# Patient Record
Sex: Male | Born: 1970 | Race: White | Hispanic: No | Marital: Married | Smoking: Former smoker
Health system: Southern US, Community
[De-identification: ages and names within clinical notes are randomized; demographics above are authoritative.]

## PROBLEM LIST (undated history)

## (undated) DIAGNOSIS — E559 Vitamin D deficiency, unspecified: Secondary | ICD-10-CM

## (undated) DIAGNOSIS — G4733 Obstructive sleep apnea (adult) (pediatric): Secondary | ICD-10-CM

## (undated) DIAGNOSIS — I10 Essential (primary) hypertension: Secondary | ICD-10-CM

## (undated) DIAGNOSIS — F32A Depression, unspecified: Secondary | ICD-10-CM

## (undated) DIAGNOSIS — N529 Male erectile dysfunction, unspecified: Secondary | ICD-10-CM

## (undated) DIAGNOSIS — E785 Hyperlipidemia, unspecified: Secondary | ICD-10-CM

## (undated) DIAGNOSIS — R7401 Elevation of levels of liver transaminase levels: Secondary | ICD-10-CM

## (undated) DIAGNOSIS — K219 Gastro-esophageal reflux disease without esophagitis: Secondary | ICD-10-CM

## (undated) DIAGNOSIS — R0683 Snoring: Secondary | ICD-10-CM

## (undated) DIAGNOSIS — R740 Nonspecific elevation of levels of transaminase and lactic acid dehydrogenase [LDH]: Secondary | ICD-10-CM

## (undated) DIAGNOSIS — F329 Major depressive disorder, single episode, unspecified: Secondary | ICD-10-CM

## (undated) DIAGNOSIS — R7989 Other specified abnormal findings of blood chemistry: Secondary | ICD-10-CM

## (undated) HISTORY — DX: Hyperlipidemia, unspecified: E78.5

## (undated) HISTORY — DX: Nonspecific elevation of levels of transaminase and lactic acid dehydrogenase (ldh): R74.0

## (undated) HISTORY — DX: Vitamin D deficiency, unspecified: E55.9

## (undated) HISTORY — DX: Elevation of levels of liver transaminase levels: R74.01

## (undated) HISTORY — DX: Snoring: R06.83

## (undated) HISTORY — DX: Obstructive sleep apnea (adult) (pediatric): G47.33

## (undated) HISTORY — DX: Depression, unspecified: F32.A

## (undated) HISTORY — DX: Male erectile dysfunction, unspecified: N52.9

## (undated) HISTORY — DX: Other specified abnormal findings of blood chemistry: R79.89

## (undated) HISTORY — DX: Gastro-esophageal reflux disease without esophagitis: K21.9

## (undated) HISTORY — DX: Essential (primary) hypertension: I10

## (undated) HISTORY — DX: Major depressive disorder, single episode, unspecified: F32.9

## (undated) HISTORY — DX: Morbid (severe) obesity due to excess calories: E66.01

---

## 2009-08-22 DIAGNOSIS — K219 Gastro-esophageal reflux disease without esophagitis: Secondary | ICD-10-CM | POA: Insufficient documentation

## 2011-02-08 ENCOUNTER — Ambulatory Visit: Payer: Self-pay | Admitting: Orthopedic Surgery

## 2011-02-26 HISTORY — PX: OTHER SURGICAL HISTORY: SHX169

## 2011-06-18 ENCOUNTER — Emergency Department: Payer: Self-pay | Admitting: Emergency Medicine

## 2011-07-10 HISTORY — PX: UMBILICAL HERNIA REPAIR: SHX196

## 2012-11-30 IMAGING — CT CT ABD-PELV W/ CM
1 of 2 series · 15 of 32 positions shown, 19 images · non-contrast
Comparison: none

REASON FOR EXAM: (1) abd pain; (2) abd pain;    NOTE: Nursing to Give
Oral CT Contrast
COMMENTS:   May transport without cardiac monitor

PROCEDURE:     CT  - CT ABDOMEN / PELVIS  W  - June 18, 2011  [DATE]
RESULT:     CT abdomen and pelvis dated 06/18/2011
TECHNIQUE: Helical 3 mm sections were obtained from the lung bases through
the pubic symphysis status post intravenous administration of 100 mL of
Msovue-1LE and oral contrast.

[Series 2: 3mm soft tissue · axial · 0.92mm/px · z∈[-1116,-636]mm · 15 of 176 slices shown, 19 images]
[im 8/176  soft-tissue]
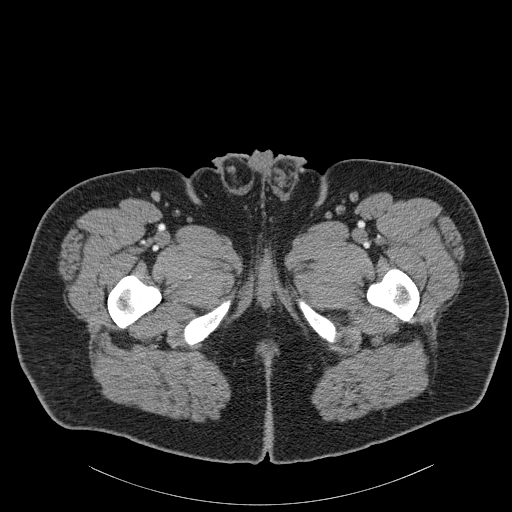
[im 8/176  bone]
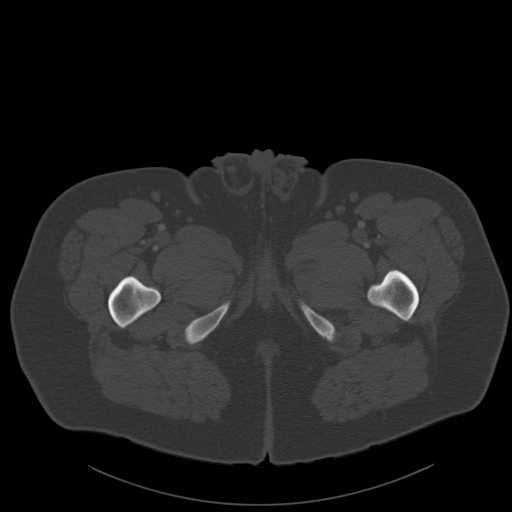
[im 22/176  soft-tissue]
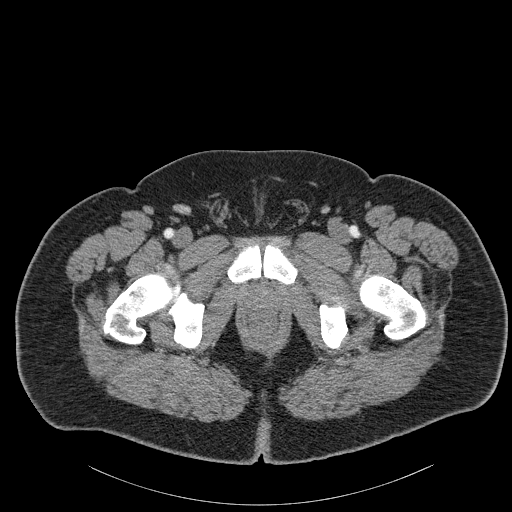
[im 37/176  soft-tissue]
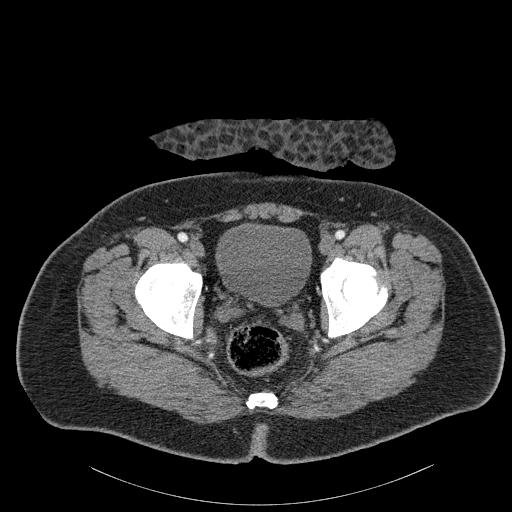
[im 52/176  soft-tissue]
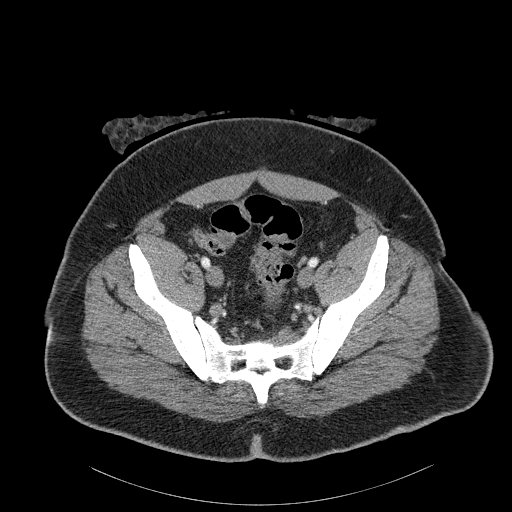
[im 59/176  soft-tissue]
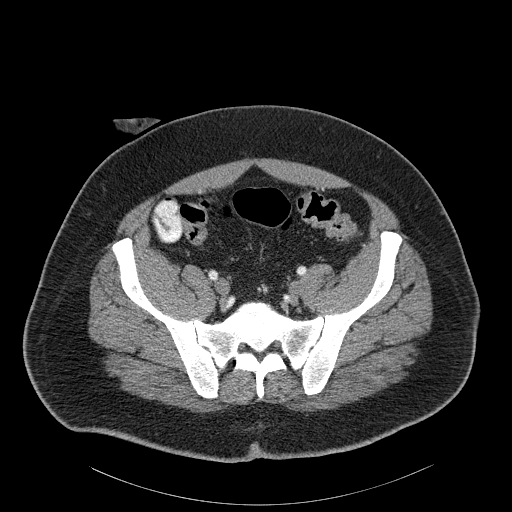
[im 73/176  soft-tissue]
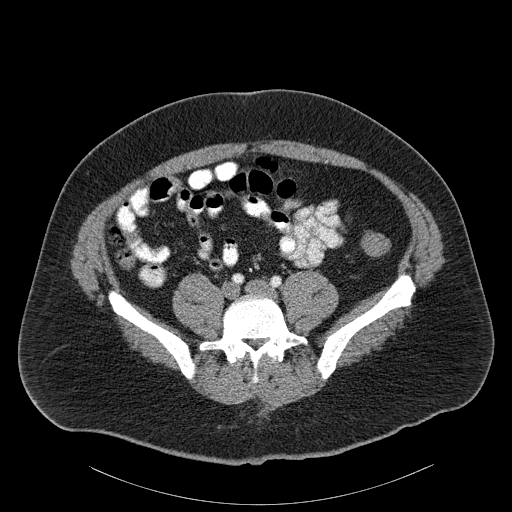
[im 88/176  soft-tissue]
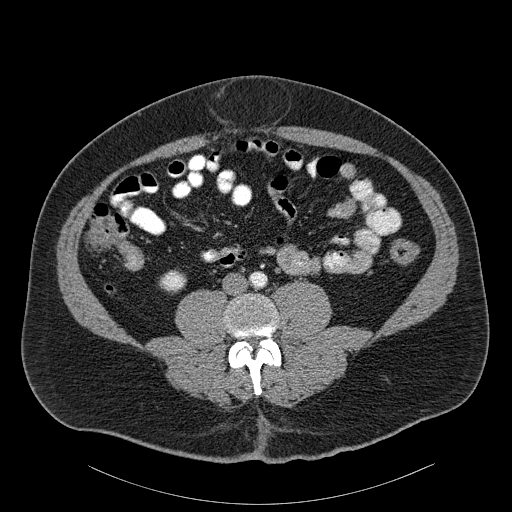
[im 103/176  soft-tissue]
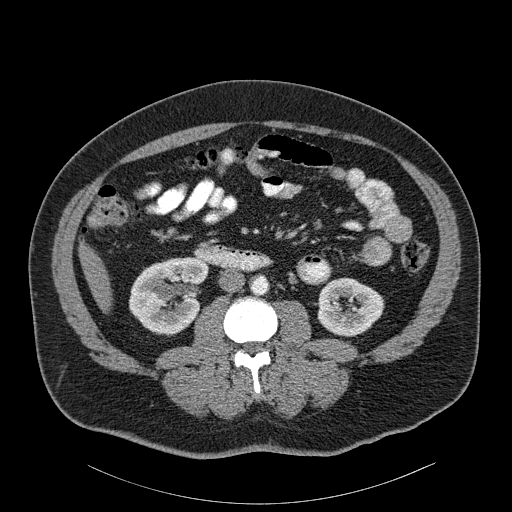
[im 117/176  soft-tissue]
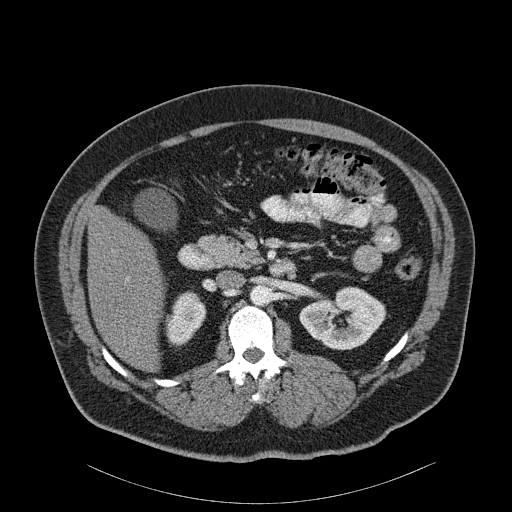
[im 117/176  bone]
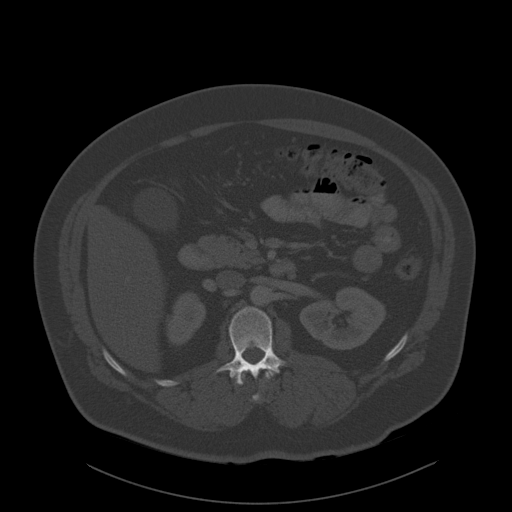
[im 124/176  soft-tissue]
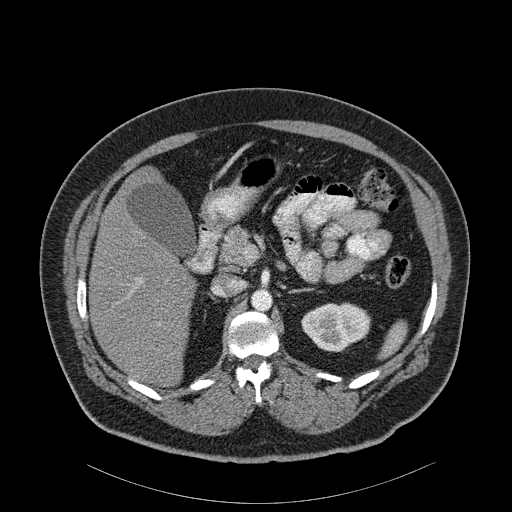
[im 139/176  soft-tissue]
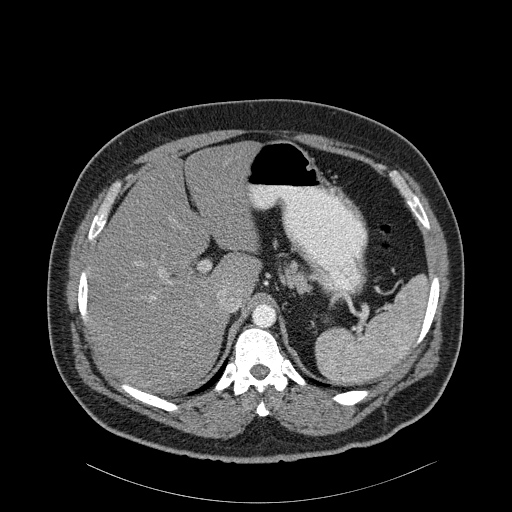
[im 146/176  lung]
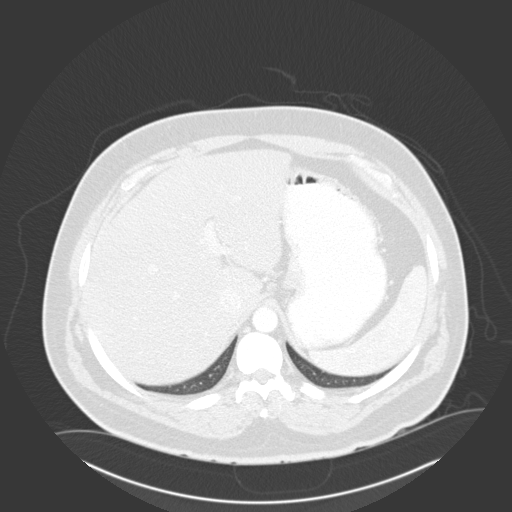
[im 154/176  soft-tissue]
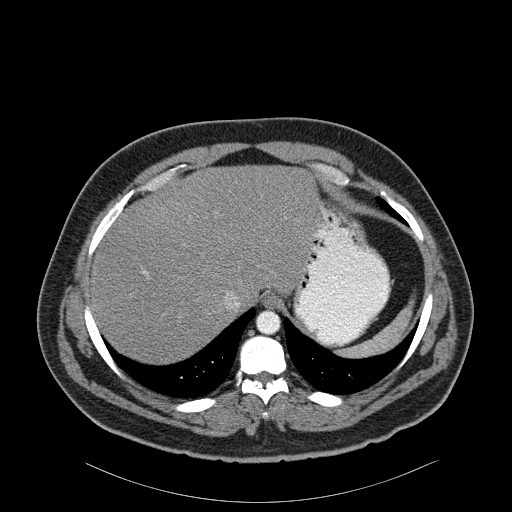
[im 154/176  lung]
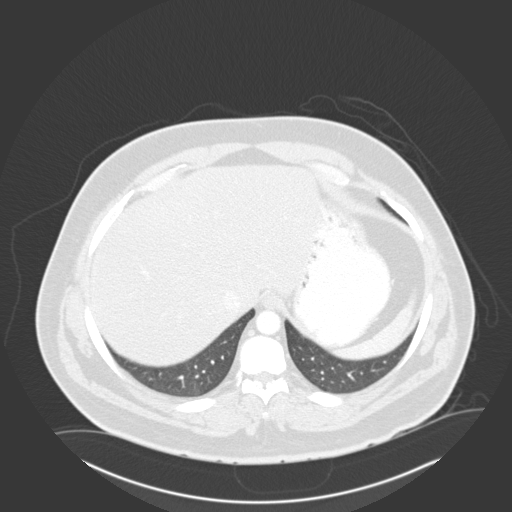
[im 161/176  lung]
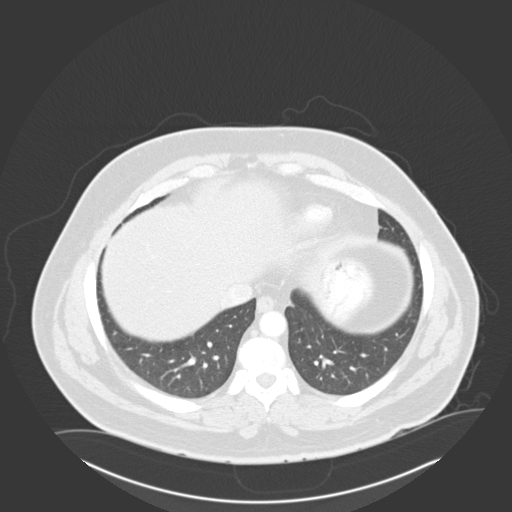
[im 168/176  soft-tissue]
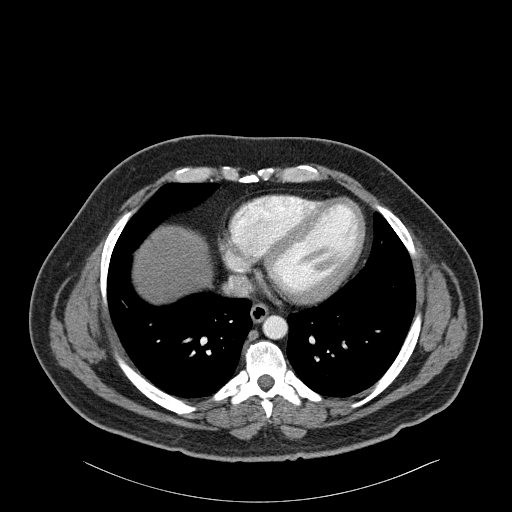
[im 168/176  lung]
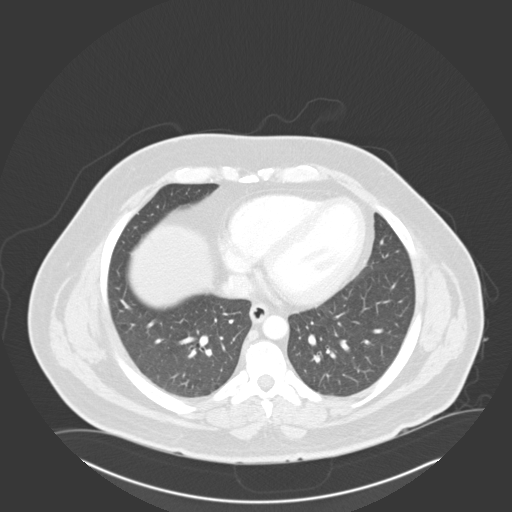

[15 of 32 positions shown; findings below may reference images not displayed]

FINDINGS: The lung bases are unremarkable.

The liver demonstrates a diffuse low attenuating architecture is otherwise
unremarkable. The spleen, adrenals, pancreas, kidneys are unremarkable.

There is no evidence of abdominal or pelvic free fluid, loculated fluid
collections nor adenopathy. No evidence of an abdominal aortic aneurysm. The
appendix is identified and is unremarkable. A fat containing ventral
abdominal wall hernia is appreciated just below the umbilicus.

Evaluation of the transverse colon demonstrates mild thickening of the bowel
wall, diffuse. A component of the thickening is due to decompression. No
appreciable free fluid nor loculated fluid collections surrounding the
bowel. There is mild diverticulosis within the transverse colon. A graft the
celiac, SMA, IMA, portal vein, SMV are opacified. No evidence of bowel
obstruction. The appendix is identified and is unremarkable.
IMPRESSION: Findings which may were present early or mild colitis
involving transverse colon as described above
2. Hepatic steatosis
3. No further evidence of obstructive or inflammatory abnormalities.

## 2013-07-03 ENCOUNTER — Ambulatory Visit: Payer: Self-pay | Admitting: Family Medicine

## 2014-07-01 LAB — HEMOGLOBIN A1C: Hgb A1c MFr Bld: 6.1 % — AB (ref 4.0–6.0)

## 2014-07-01 LAB — LIPID PANEL
Cholesterol: 181 mg/dL (ref 0–200)
HDL: 26 mg/dL — AB (ref 35–70)
LDL Cholesterol: 116 mg/dL
Triglycerides: 197 mg/dL — AB (ref 40–160)

## 2014-09-28 ENCOUNTER — Ambulatory Visit: Payer: Self-pay | Admitting: Family Medicine

## 2015-04-07 ENCOUNTER — Telehealth: Payer: Self-pay

## 2015-04-07 MED ORDER — OMEPRAZOLE 20 MG PO CPDR: 20.0000 mg | DELAYED_RELEASE_CAPSULE | Freq: Every day | ORAL | Status: DC

## 2015-04-07 MED ORDER — ATORVASTATIN CALCIUM 40 MG PO TABS: 40.0000 mg | ORAL_TABLET | Freq: Every day | ORAL | Status: DC

## 2015-04-07 MED ORDER — METFORMIN HCL ER (OSM) 500 MG PO TB24: 500.0000 mg | ORAL_TABLET | Freq: Every day | ORAL | Status: DC

## 2015-04-07 MED ORDER — VENLAFAXINE HCL ER 150 MG PO CP24: 150.0000 mg | ORAL_CAPSULE | Freq: Every day | ORAL | Status: DC

## 2015-04-07 NOTE — Telephone Encounter (Signed)
Sent rx to [facility]

## 2015-04-07 NOTE — Telephone Encounter (Signed)
Pt wife called patient is working out of town, but has already scheduled an appointment for next month, but needs refills for at least one month until he can be seen.  On chol, gerd and mood

## 2015-04-19 ENCOUNTER — Telehealth: Payer: Self-pay | Admitting: Family Medicine

## 2015-04-19 ENCOUNTER — Other Ambulatory Visit: Payer: Self-pay | Admitting: Family Medicine

## 2015-04-19 NOTE — Telephone Encounter (Signed)
Pt wife said that they are on vacation and that her husband forgot his depression medication at home and is getting a request from [place] asking to be able to get 1 week supply sent to [facility] for the patient. This is just a [place]

## 2015-04-19 NOTE — Telephone Encounter (Signed)
Patient requesting refill. 

## 2015-04-19 NOTE — Telephone Encounter (Signed)
Okay to approve.

## 2015-04-23 ENCOUNTER — Encounter: Payer: Self-pay | Admitting: Family Medicine

## 2015-04-23 DIAGNOSIS — Z8719 Personal history of other diseases of the digestive system: Secondary | ICD-10-CM | POA: Insufficient documentation

## 2015-04-23 DIAGNOSIS — N529 Male erectile dysfunction, unspecified: Secondary | ICD-10-CM | POA: Insufficient documentation

## 2015-04-23 DIAGNOSIS — E785 Hyperlipidemia, unspecified: Secondary | ICD-10-CM | POA: Insufficient documentation

## 2015-04-23 DIAGNOSIS — E8881 Metabolic syndrome: Secondary | ICD-10-CM | POA: Insufficient documentation

## 2015-04-23 DIAGNOSIS — E559 Vitamin D deficiency, unspecified: Secondary | ICD-10-CM | POA: Insufficient documentation

## 2015-04-23 DIAGNOSIS — G4733 Obstructive sleep apnea (adult) (pediatric): Secondary | ICD-10-CM | POA: Insufficient documentation

## 2015-04-23 DIAGNOSIS — R7989 Other specified abnormal findings of blood chemistry: Secondary | ICD-10-CM | POA: Insufficient documentation

## 2015-04-23 DIAGNOSIS — Z9889 Other specified postprocedural states: Secondary | ICD-10-CM | POA: Insufficient documentation

## 2015-04-23 DIAGNOSIS — G4726 Circadian rhythm sleep disorder, shift work type: Secondary | ICD-10-CM | POA: Insufficient documentation

## 2015-04-24 ENCOUNTER — Encounter: Payer: Self-pay | Admitting: Family Medicine

## 2015-04-24 ENCOUNTER — Ambulatory Visit (INDEPENDENT_AMBULATORY_CARE_PROVIDER_SITE_OTHER): Payer: BLUE CROSS/BLUE SHIELD | Admitting: Family Medicine

## 2015-04-24 VITALS — BP 134/76 | HR 82 | Temp 98.1°F | Resp 16 | Ht 70.0 in | Wt 324.2 lb

## 2015-04-24 DIAGNOSIS — E668 Other obesity: Secondary | ICD-10-CM

## 2015-04-24 DIAGNOSIS — E559 Vitamin D deficiency, unspecified: Secondary | ICD-10-CM | POA: Diagnosis not present

## 2015-04-24 DIAGNOSIS — K219 Gastro-esophageal reflux disease without esophagitis: Secondary | ICD-10-CM | POA: Diagnosis not present

## 2015-04-24 DIAGNOSIS — E8881 Metabolic syndrome: Secondary | ICD-10-CM | POA: Diagnosis not present

## 2015-04-24 DIAGNOSIS — N528 Other male erectile dysfunction: Secondary | ICD-10-CM | POA: Diagnosis not present

## 2015-04-24 DIAGNOSIS — Z79899 Other long term (current) drug therapy: Secondary | ICD-10-CM

## 2015-04-24 DIAGNOSIS — G4733 Obstructive sleep apnea (adult) (pediatric): Secondary | ICD-10-CM

## 2015-04-24 DIAGNOSIS — E785 Hyperlipidemia, unspecified: Secondary | ICD-10-CM | POA: Diagnosis not present

## 2015-04-24 DIAGNOSIS — N529 Male erectile dysfunction, unspecified: Secondary | ICD-10-CM

## 2015-04-24 DIAGNOSIS — F329 Major depressive disorder, single episode, unspecified: Secondary | ICD-10-CM

## 2015-04-24 DIAGNOSIS — F32A Depression, unspecified: Secondary | ICD-10-CM

## 2015-04-24 MED ORDER — VENLAFAXINE HCL ER 75 MG PO CP24: 225.0000 mg | ORAL_CAPSULE | Freq: Every day | ORAL | Status: DC

## 2015-04-24 MED ORDER — SILDENAFIL CITRATE 20 MG PO TABS: 40.0000 mg | ORAL_TABLET | ORAL | Status: DC | PRN

## 2015-04-24 MED ORDER — ATORVASTATIN CALCIUM 40 MG PO TABS: 40.0000 mg | ORAL_TABLET | Freq: Every day | ORAL | Status: DC

## 2015-04-24 MED ORDER — OMEPRAZOLE 20 MG PO CPDR: 20.0000 mg | DELAYED_RELEASE_CAPSULE | Freq: Every day | ORAL | Status: DC

## 2015-04-24 MED ORDER — METFORMIN HCL ER (OSM) 500 MG PO TB24: 500.0000 mg | ORAL_TABLET | Freq: Every day | ORAL | Status: DC

## 2015-04-24 NOTE — Progress Notes (Signed)
Name: Jimmy Bryant   MRN: [id number]    DOB: 1971-08-12   Date:04/24/2015       Progress Note  Subjective  Chief Complaint  Chief Complaint  Patient presents with  . Medication Refill    6 week F/U  . Obesity    Last visit- Started Contrave-Patient states he felt nothing and did not help weight loss-Actually patient gained 8 pounds.     HPI  Dysmetabolic Syndrome: taking Metformin ER 500mg , gaining weight, avoiding sweet beverages.  Gaining weight, due for repeat labs.   OSA: using CPAP machine every night , all night long, excellent compliance, he feels well when he wakes up.  ED: using generic Viagra, works for him, but it takes hours.  Denies side effects of medication.  Obesity: tried Contrave without success. He states he has decreased soda intake and when he drinks he drinks Sprite Zero. Still gaining weight. Wife is going to nutritionist and he will try to follow her diet.  Depression: doing well on medication, but feels like wears by 5 pm, we will adjust dose. He gets irritable towards the end of the day. He denies anhedonia. He states he went out medication for a couple of days and felt down for a few days.  Patient Active Problem List   Diagnosis Date Noted  . Circadian rhythm sleep disorder, shift work type 04/23/2015  . Dyslipidemia 04/23/2015  . H/O lumbosacral spine surgery 04/23/2015  . H/O umbilical hernia repair 92/42/6834  . Dysmetabolic syndrome 19/62/2297  . Extreme obesity 04/23/2015  . ED (erectile dysfunction) of organic origin 04/23/2015  . Obstructive apnea 04/23/2015  . Vitamin D deficiency 04/23/2015  . Decreased testosterone level 04/23/2015  . Gastro-esophageal reflux disease without esophagitis 08/22/2009  . Clinical depression 04/07/2009    Past Surgical History  Procedure Laterality Date  . Umbilical hernia repair  07/10/2011  . Microdecompression lumbar spine  02/2011    L4-5 and L5-S1 by Dr. Babette Relic    Family History  Problem  Relation Age of Onset  . Diabetes Mother   . Hypertension Father   . CAD Father   . Hypertension Sister   . Hypertension Daughter     History   Social History  . Marital Status: Married    Spouse Name: N/A  . Number of Children: N/A  . Years of Education: N/A   Occupational History  . Not on file.   Social History Main Topics  . Smoking status: Former Smoker -- 1.00 packs/day for 31 years    Types: Cigarettes    Start date: 10/28/1980    Quit date: 06/28/2012  . Smokeless tobacco: Never Used  . Alcohol Use: 0.0 oz/week    0 Standard drinks or equivalent per week     Comment: rarely-a beer a month  . Drug Use: No  . Sexual Activity: No   Other Topics Concern  . Not on file   Social History Narrative     Current outpatient prescriptions:  .  aspirin 81 MG tablet, Take 1 tablet by mouth daily., Disp: , Rfl:  .  atorvastatin (LIPITOR) 40 MG tablet, Take 1 tablet (40 mg total) by mouth daily., Disp: 30 tablet, Rfl: 3 .  Cholecalciferol (VITAMIN D3) 2000 UNITS capsule, Take 1 capsule by mouth daily., Disp: , Rfl:  .  ibuprofen (ADVIL,MOTRIN) 200 MG tablet, Take 2 tablets by mouth as needed., Disp: , Rfl:  .  omeprazole (PRILOSEC) 20 MG capsule, Take 1 capsule (20 mg total) by  mouth daily., Disp: 30 capsule, Rfl: 3 .  metformin (FORTAMET) 500 MG (OSM) 24 hr tablet, Take 1 tablet (500 mg total) by mouth daily with breakfast., Disp: 30 tablet, Rfl: 3 .  sildenafil (REVATIO) 20 MG tablet, Take 2-3 tablets (40-60 mg total) by mouth as needed., Disp: 10 tablet, Rfl: 3 .  venlafaxine XR (EFFEXOR XR) 75 MG 24 hr capsule, Take 3 capsules (225 mg total) by mouth daily with breakfast., Disp: 90 capsule, Rfl: 3  No Known Allergies   ROS  Constitutional: Negative for fever and positive weight change.  Respiratory: Negative for cough and shortness of breath.   Cardiovascular: Negative for chest pain , one episode of  Palpitations lasted a few seconds  Gastrointestinal: Negative  for abdominal pain, no bowel changes.  Musculoskeletal: Negative for gait problem or joint swelling.  Skin: Negative for rash.  Neurological: Negative for dizziness or headache.  No other specific complaints in a complete review of systems (except as listed in HPI above).  Objective  Filed Vitals:   04/24/15 1043  BP: 144/78  Pulse: 82  Temp: 98.1 F (36.7 C)  TempSrc: Oral  Resp: 16  Height: 5\' 10"  (1.778 m)  Weight: 324 lb 3.2 oz (147.056 kg)  SpO2: 96%    Body mass index is 46.52 kg/(m^2).  Physical Exam  Constitutional: Patient appears well-developed and well-nourished. No distress.  Eyes:  No scleral icterus.  Neck: Normal range of motion. Neck supple. Cardiovascular: Normal rate, regular rhythm and normal heart sounds.  No murmur heard. No BLE edema. Pulmonary/Chest: Effort normal and breath sounds normal. No respiratory distress. Abdominal: Soft.  There is no tenderness. Psychiatric: Patient has a normal mood and affect. behavior is normal. Judgment and thought content normal.     PHQ2/9: Depression screen PHQ 2/9 04/24/2015  Decreased Interest 2  Down, Depressed, Hopeless 0  PHQ - 2 Score 2  Altered sleeping 0  Tired, decreased energy 2  Change in appetite 0  Feeling bad or failure about yourself  3  Trouble concentrating 0  Moving slowly or fidgety/restless 0  Suicidal thoughts 0  PHQ-9 Score 7  Difficult doing work/chores Not difficult at all     Fall Risk: Fall Risk  04/24/2015  Falls in the past year? No     Assessment & Plan  1. Obstructive apnea Compliant with CPAP   2. Vitamin D deficiency  - Vitamin D (25 hydroxy)  3.ED  - sildenafil (REVATIO) 20 MG tablet; Take 2-3 tablets (40-60 mg total) by mouth as needed.  Dispense: 10 tablet; Refill: 3  4. Dysmetabolic syndrome  - HgB J6R - metformin (FORTAMET) 500 MG (OSM) 24 hr tablet; Take 1 tablet (500 mg total) by mouth daily with breakfast.  Dispense: 30 tablet; Refill: 3  5.  Extreme obesity Discussed diet and exercise  6. Dyslipidemia  - Lipid Profile - atorvastatin (LIPITOR) 40 MG tablet; Take 1 tablet (40 mg total) by mouth daily.  Dispense: 30 tablet; Refill: 3  7. Clinical depression Increase dose of medication - venlafaxine XR (EFFEXOR XR) 75 MG 24 hr capsule; Take 3 capsules (225 mg total) by mouth daily with breakfast.  Dispense: 90 capsule; Refill: 3  8. Long-term use of high-risk medication  - Comprehensive Metabolic Panel (CMET)  9. Gastroesophageal reflux disease without esophagitis  - omeprazole (PRILOSEC) 20 MG capsule; Take 1 capsule (20 mg total) by mouth daily.  Dispense: 30 capsule; Refill: 3

## 2015-04-24 NOTE — Patient Instructions (Signed)

## 2015-04-29 LAB — LIPID PANEL
Chol/HDL Ratio: 4.4 ratio units (ref 0.0–5.0)
Cholesterol, Total: 128 mg/dL (ref 100–199)
HDL: 29 mg/dL — ABNORMAL LOW (ref >39)
LDL Calculated: 75 mg/dL (ref 0–99)
Triglycerides: 118 mg/dL (ref 0–149)
VLDL Cholesterol Cal: 24 mg/dL (ref 5–40)

## 2015-04-29 LAB — COMPREHENSIVE METABOLIC PANEL
ALT: 46 IU/L — ABNORMAL HIGH (ref 0–44)
AST: 30 IU/L (ref 0–40)
Albumin/Globulin Ratio: 1.5 (ref 1.1–2.5)
Albumin: 4.3 g/dL (ref 3.5–5.5)
Alkaline Phosphatase: 72 IU/L (ref 39–117)
BUN/Creatinine Ratio: 16 (ref 9–20)
BUN: 12 mg/dL (ref 6–24)
Bilirubin Total: 0.5 mg/dL (ref 0.0–1.2)
CO2: 25 mmol/L (ref 18–29)
Calcium: 9.5 mg/dL (ref 8.7–10.2)
Chloride: 98 mmol/L (ref 97–108)
Creatinine, Ser: 0.77 mg/dL (ref 0.76–1.27)
GFR calc Af Amer: 127 mL/min/{1.73_m2} (ref >59)
GFR calc non Af Amer: 110 mL/min/{1.73_m2} (ref >59)
Globulin, Total: 2.9 g/dL (ref 1.5–4.5)
Glucose: 100 mg/dL — ABNORMAL HIGH (ref 65–99)
Potassium: 4.7 mmol/L (ref 3.5–5.2)
Sodium: 138 mmol/L (ref 134–144)
Total Protein: 7.2 g/dL (ref 6.0–8.5)

## 2015-04-29 LAB — HEMOGLOBIN A1C
Est. average glucose Bld gHb Est-mCnc: 140 mg/dL
Hgb A1c MFr Bld: 6.5 % — ABNORMAL HIGH (ref 4.8–5.6)

## 2015-04-29 LAB — VITAMIN D 25 HYDROXY (VIT D DEFICIENCY, FRACTURES): Vit D, 25-Hydroxy: 33.1 ng/mL (ref 30.0–100.0)

## 2015-04-30 ENCOUNTER — Encounter: Payer: Self-pay | Admitting: Family Medicine

## 2015-04-30 DIAGNOSIS — E1169 Type 2 diabetes mellitus with other specified complication: Secondary | ICD-10-CM | POA: Insufficient documentation

## 2015-04-30 DIAGNOSIS — E785 Hyperlipidemia, unspecified: Secondary | ICD-10-CM | POA: Insufficient documentation

## 2015-08-28 ENCOUNTER — Ambulatory Visit (INDEPENDENT_AMBULATORY_CARE_PROVIDER_SITE_OTHER): Payer: PRIVATE HEALTH INSURANCE | Admitting: Family Medicine

## 2015-08-28 ENCOUNTER — Encounter: Payer: Self-pay | Admitting: Family Medicine

## 2015-08-28 VITALS — BP 138/72 | HR 87 | Temp 98.3°F | Resp 18 | Ht 70.0 in | Wt 324.9 lb

## 2015-08-28 DIAGNOSIS — E785 Hyperlipidemia, unspecified: Secondary | ICD-10-CM | POA: Diagnosis not present

## 2015-08-28 DIAGNOSIS — K219 Gastro-esophageal reflux disease without esophagitis: Secondary | ICD-10-CM

## 2015-08-28 DIAGNOSIS — R109 Unspecified abdominal pain: Secondary | ICD-10-CM | POA: Diagnosis not present

## 2015-08-28 DIAGNOSIS — G4733 Obstructive sleep apnea (adult) (pediatric): Secondary | ICD-10-CM

## 2015-08-28 DIAGNOSIS — F330 Major depressive disorder, recurrent, mild: Secondary | ICD-10-CM

## 2015-08-28 DIAGNOSIS — Z23 Encounter for immunization: Secondary | ICD-10-CM

## 2015-08-28 DIAGNOSIS — E118 Type 2 diabetes mellitus with unspecified complications: Secondary | ICD-10-CM

## 2015-08-28 DIAGNOSIS — N528 Other male erectile dysfunction: Secondary | ICD-10-CM

## 2015-08-28 DIAGNOSIS — N529 Male erectile dysfunction, unspecified: Secondary | ICD-10-CM

## 2015-08-28 LAB — POCT URINALYSIS DIPSTICK
Bilirubin, UA: NEGATIVE
Blood, UA: NEGATIVE
Glucose, UA: NEGATIVE
Ketones, UA: NEGATIVE
Leukocytes, UA: NEGATIVE
Nitrite, UA: NEGATIVE
Protein, UA: NEGATIVE
Spec Grav, UA: 1.015
Urobilinogen, UA: NEGATIVE
pH, UA: 5

## 2015-08-28 LAB — POCT GLYCOSYLATED HEMOGLOBIN (HGB A1C): Hemoglobin A1C: 6.2

## 2015-08-28 LAB — POCT UA - MICROALBUMIN: Microalbumin Ur, POC: 20 mg/L

## 2015-08-28 MED ORDER — SILDENAFIL CITRATE 100 MG PO TABS: 50.0000 mg | ORAL_TABLET | Freq: Every day | ORAL | Status: DC | PRN

## 2015-08-28 MED ORDER — METFORMIN HCL ER (OSM) 500 MG PO TB24: 500.0000 mg | ORAL_TABLET | Freq: Every day | ORAL | Status: DC

## 2015-08-28 MED ORDER — VENLAFAXINE HCL ER 75 MG PO CP24: 225.0000 mg | ORAL_CAPSULE | Freq: Every day | ORAL | Status: DC

## 2015-08-28 MED ORDER — ATORVASTATIN CALCIUM 40 MG PO TABS: 40.0000 mg | ORAL_TABLET | Freq: Every day | ORAL | Status: DC

## 2015-08-28 NOTE — Progress Notes (Signed)
Name: Jimmy Bryant   MRN: [id number]    DOB: Nov 23, 1970   Date:08/28/2015       Progress Note  Subjective  Chief Complaint  Chief Complaint  Patient presents with  . Medication Refill    3 month F/U  . Diabetes    Does not check sugar at home, states every once in a while he will check it and it would be low-110  . Gastroesophageal Reflux    well controlled taking every 3 days and zantac in between  . Hyperlipidemia    muscle cramps  . Depression    improving  . 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 No polydipsia , polyuria or polyphagia. Taking Metformin and denies side effects. Taking aspirin daily   Hyperlipidemia: he is taking Atorvastatin and states he has noticed some muscle cramps lately. He also states that when cold he forgets to drink water. He states it is usually at the end of the day, and can happen on calves on lower arms. Helps when he gets up and moves around ( discussed RLS- but he does not want medication at this time)   Obesity: he has gained a lot of weight since last visit, discussed life style modification with him again  ED: he has been taking generic Viagra, but is more expensive than the brand medication since he needs take 3 daily. He states it works well to initiate and erection, but erection does not stay for long  OSA: using CPAP machine every night, all night. Feels more rested, he has also noticed that he can dream now.   Back pain: he has noticed some pain/discomfort on left mid to lower back, when he sits down either to watch TV or drive, pain resolves when he shifts in his seat. Described an aggravating pain but not severe.   Major Depression; doing better on 3 pills of  Effexor daily, states symptoms of depression present since childhood. He is feeling well at this time, normal level of energy, able to focus better and no suicidal thoughts or ideation  Patient Active Problem List   Diagnosis Date Noted  . Diabetes mellitus type II, controlled ([place]) 04/30/2015  . Circadian rhythm sleep disorder, shift work type 04/23/2015  . Dyslipidemia 04/23/2015  . H/O lumbosacral spine surgery 04/23/2015  . H/O umbilical hernia repair 09/38/1829  . Dysmetabolic syndrome 93/71/6967  . Extreme obesity ([place]) 04/23/2015  . ED (erectile dysfunction) of organic origin 04/23/2015  . Obstructive apnea 04/23/2015  . Vitamin D deficiency 04/23/2015  . Decreased testosterone level 04/23/2015  . Gastro-esophageal reflux disease without esophagitis 08/22/2009  . Clinical depression 04/07/2009    Past Surgical History  Procedure Laterality Date  . Umbilical hernia repair  07/10/2011  . Microdecompression lumbar spine  02/2011    L4-5 and L5-S1 by Dr. Babette Relic    Family History  Problem Relation Age of Onset  . Diabetes Mother   . Hypertension Father   . CAD Father   . Hypertension Sister   . Hypertension Daughter     Social History   Social History  . Marital Status: Married  Spouse Name: N/A  . Number of Children: N/A  . Years of Education: N/A   Occupational History  . Not on file.   Social History Main Topics  . Smoking status: Former Smoker -- 1.00 packs/day for 31 years    Types: Cigarettes    Start date: 10/28/1980    Quit date: 06/28/2012  . Smokeless tobacco: Never Used  . Alcohol Use: 0.0 oz/week    0 Standard drinks or equivalent per week     Comment: rarely-a beer a month  . Drug Use: No  . Sexual Activity:    Partners: Female   Other Topics Concern  . Not on file   Social History Narrative     Current outpatient prescriptions:  .  aspirin 81 MG tablet, Take 1 tablet by mouth daily., Disp: , Rfl:  .  atorvastatin (LIPITOR) 40 MG  tablet, Take 1 tablet (40 mg total) by mouth daily., Disp: 30 tablet, Rfl: 3 .  Cholecalciferol (VITAMIN D3) 2000 UNITS capsule, Take 1 capsule by mouth daily., Disp: , Rfl:  .  ibuprofen (ADVIL,MOTRIN) 200 MG tablet, Take 2 tablets by mouth as needed., Disp: , Rfl:  .  metformin (FORTAMET) 500 MG (OSM) 24 hr tablet, Take 1 tablet (500 mg total) by mouth daily with breakfast., Disp: 30 tablet, Rfl: 3 .  omeprazole (PRILOSEC) 20 MG capsule, Take 1 capsule (20 mg total) by mouth daily., Disp: 30 capsule, Rfl: 3 .  sildenafil (VIAGRA) 100 MG tablet, Take 0.5-1 tablets (50-100 mg total) by mouth daily as needed for erectile dysfunction., Disp: 10 tablet, Rfl: 5 .  venlafaxine XR (EFFEXOR XR) 75 MG 24 hr capsule, Take 3 capsules (225 mg total) by mouth daily with breakfast., Disp: 90 capsule, Rfl: 3  No Known Allergies   ROS  Constitutional: Negative for fever, positive for  weight change.  Respiratory: Negative for cough and shortness of breath.   Cardiovascular: Negative for chest pain or palpitations.  Gastrointestinal: Negative for abdominal pain, no bowel changes.  Musculoskeletal: Negative for gait problem or joint swelling.  Skin: Negative for rash.  Neurological: Negative for dizziness or headache.  No other specific complaints in a complete review of systems (except as listed in HPI above).  Objective  Filed Vitals:   08/28/15 0825  BP: 138/72  Pulse: 87  Temp: 98.3 F (36.8 C)  TempSrc: Oral  Resp: 18  Height: 5\' 10"  (1.778 m)  Weight: 324 lb 14.4 oz (147.374 kg)  SpO2: 98%    Body mass index is 46.62 kg/(m^2).  Physical Exam  Constitutional: Patient appears well-developed and well-nourished. Obese No distress.  HEENT: head atraumatic, normocephalic, pupils equal and reactive to light,  neck supple, throat within normal limits Cardiovascular: Normal rate, regular rhythm and normal heart sounds.  No murmur heard. No BLE edema. Pulmonary/Chest: Effort normal and breath  sounds normal. No respiratory distress. Abdominal: Soft.  There is no tenderness. Psychiatric: Patient has a normal mood and affect. behavior is normal. Judgment and thought content normal. Muscular Skeletal: point tenderness during palpation of left mid thoracic area and towards left flank, it seems muscular even though around left CVA area. No rashes. Normal rom of lumbar spine.   Recent Results (from the past 2160 hour(s))  POCT HgB A1C     Status: Abnormal   Collection Time: 08/28/15  8:29 AM  Result Value Ref Range   Hemoglobin A1C 6.2   POCT UA - Microalbumin     Status: Abnormal   Collection  Time: 08/28/15  8:29 AM  Result Value Ref Range   Microalbumin Ur, POC 20 mg/L   Creatinine, POC  mg/dL   Albumin/Creatinine Ratio, Urine, POC      Diabetic Foot Exam: Diabetic Foot Exam - Simple   Simple Foot Form  Visual Inspection  No deformities, no ulcerations, no other skin breakdown bilaterally:  Yes  Sensation Testing  Pulse Check  Comments      PHQ2/9: Depression screen [facility] 2/9 08/28/2015 04/24/2015  Decreased Interest 0 2  Down, Depressed, Hopeless 0 0  PHQ - 2 Score 0 2  Altered sleeping - 0  Tired, decreased energy - 2  Change in appetite - 0  Feeling bad or failure about yourself  - 3  Trouble concentrating - 0  Moving slowly or fidgety/restless - 0  Suicidal thoughts - 0  PHQ-9 Score - 7  Difficult doing work/chores - Not difficult at all    Fall Risk: Fall Risk  08/28/2015 04/24/2015  Falls in the past year? No No     Functional Status Survey: Is the patient deaf or have difficulty hearing?: No Does the patient have difficulty seeing, even when wearing glasses/contacts?: Yes (glasses) Does the patient have difficulty concentrating, remembering, or making decisions?: No Does the patient have difficulty walking or climbing stairs?: No Does the patient have difficulty dressing or bathing?: No Does the patient have difficulty doing errands alone such as  visiting a doctor's office or shopping?: No    Assessment & Plan  1. Controlled type 2 diabetes mellitus with complication, without long-term current use of insulin (HCC)  - POCT HgB A1C - POCT UA - Microalbumin - metformin (FORTAMET) 500 MG (OSM) 24 hr tablet; Take 1 tablet (500 mg total) by mouth daily with breakfast.  Dispense: 30 tablet; Refill: 3  2. Needs flu shot  - Flu Vaccine QUAD 36+ mos PF IM (Fluarix & Fluzone Quad PF)  3. Obstructive apnea  Continue CPAP machine use daily   4. ED (erectile dysfunction) of organic origin  - sildenafil (VIAGRA) 100 MG tablet; Take 0.5-1 tablets (50-100 mg total) by mouth daily as needed for erectile dysfunction.  Dispense: 10 tablet; Refill: 5  5. Morbid obesity due to excess calories [facility])  Discussed with the patient the risk posed by an increased BMI. Discussed importance of portion control, calorie counting and at least 150 minutes of physical activity weekly. Avoid sweet beverages and drink more water. Eat at least 6 servings of fruit and vegetables daily   6. Gastroesophageal reflux disease without esophagitis  Weaning off Omeprazole  7. Mild episode of recurrent major depressive disorder (HCC)  Doing well on medication, higher dose - venlafaxine XR (EFFEXOR XR) 75 MG 24 hr capsule; Take 3 capsules (225 mg total) by mouth daily with breakfast.  Dispense: 90 capsule; Refill: 3  8. Dyslipidemia  Continue medication, symptoms of spasms, seems to be RLS but he wants to hold off on medication  - atorvastatin (LIPITOR) 40 MG tablet; Take 1 tablet (40 mg total) by mouth daily.  Dispense: 30 tablet; Refill: 3   9. Left flank pain  We will check for blood and if negative, he will try chiropractor ( wife goes to one ) and if no improvement he will call back for kidney US.  - POCT Urinalysis Dipstick

## 2015-09-11 ENCOUNTER — Ambulatory Visit (INDEPENDENT_AMBULATORY_CARE_PROVIDER_SITE_OTHER): Payer: PRIVATE HEALTH INSURANCE | Admitting: Family Medicine

## 2015-09-11 ENCOUNTER — Encounter: Payer: Self-pay | Admitting: Family Medicine

## 2015-09-11 VITALS — BP 134/86 | HR 91 | Temp 97.9°F | Resp 18 | Ht 70.0 in | Wt 329.2 lb

## 2015-09-11 DIAGNOSIS — R208 Other disturbances of skin sensation: Secondary | ICD-10-CM | POA: Diagnosis not present

## 2015-09-11 DIAGNOSIS — Z0000 Encounter for general adult medical examination without abnormal findings: Secondary | ICD-10-CM | POA: Diagnosis not present

## 2015-09-11 DIAGNOSIS — Z23 Encounter for immunization: Secondary | ICD-10-CM | POA: Diagnosis not present

## 2015-09-11 MED ORDER — AMITRIPTYLINE HCL 10 MG PO TABS
10.0000 mg | ORAL_TABLET | Freq: Every day | ORAL | Status: DC
Start: 2015-09-11 — End: 2016-05-31

## 2015-09-11 NOTE — Progress Notes (Signed)
Name: Jimmy Bryant   MRN: [id number]    DOB: 1971-03-06   Date:09/11/2015       Progress Note  Subjective  Chief Complaint  Chief Complaint  Patient presents with  . Annual Exam    HPI  Male Physical : he denies symptoms of BPH such as hesitancy, straining, dribbling or nocturia. He continues to have decrease in libido and ED, tried multiple medications without improvement.  Seen by Urologist in the past.   Patient Active Problem List   Diagnosis Date Noted  . Diabetes mellitus type II, controlled ([place]) 04/30/2015  . Circadian rhythm sleep disorder, shift work type 04/23/2015  . Dyslipidemia 04/23/2015  . H/O lumbosacral spine surgery 04/23/2015  . H/O umbilical hernia repair 123456  . Dysmetabolic syndrome 123456  . Extreme obesity ([place]) 04/23/2015  . ED (erectile dysfunction) of organic origin 04/23/2015  . Obstructive apnea 04/23/2015  . Vitamin D deficiency 04/23/2015  . Decreased testosterone level 04/23/2015  . Gastro-esophageal reflux disease without esophagitis 08/22/2009  . Clinical depression 04/07/2009    Past Surgical History  Procedure Laterality Date  . Umbilical hernia repair  07/10/2011  . Microdecompression lumbar spine  02/2011    L4-5 and L5-S1 by Dr. Babette Relic    Family History  Problem Relation Age of Onset  . Diabetes Mother   . Hypertension Father   . CAD Father   . Hypertension Sister   . Hypertension Daughter     Social History   Social History  . Marital Status: Married    Spouse Name: N/A  . Number of Children: N/A  . Years of Education: N/A   Occupational History  . Not on file.   Social History Main Topics  . Smoking status: Former Smoker -- 1.00 packs/day for 31 years    Types: Cigarettes    Start date: 10/28/1980    Quit date: 06/28/2012  . Smokeless tobacco: Never Used  . Alcohol Use: 0.0 oz/week    0 Standard drinks or equivalent per week     Comment: rarely-a beer a month  . Drug Use: No  . Sexual  Activity:    Partners: Female   Other Topics Concern  . Not on file   Social History Narrative     Current outpatient prescriptions:  .  aspirin 81 MG tablet, Take 1 tablet by mouth daily., Disp: , Rfl:  .  atorvastatin (LIPITOR) 40 MG tablet, Take 1 tablet (40 mg total) by mouth daily., Disp: 30 tablet, Rfl: 3 .  Cholecalciferol (VITAMIN D3) 2000 UNITS capsule, Take 1 capsule by mouth daily., Disp: , Rfl:  .  ibuprofen (ADVIL,MOTRIN) 200 MG tablet, Take 2 tablets by mouth as needed., Disp: , Rfl:  .  metformin (FORTAMET) 500 MG (OSM) 24 hr tablet, Take 1 tablet (500 mg total) by mouth daily with breakfast., Disp: 30 tablet, Rfl: 3 .  omeprazole (PRILOSEC) 20 MG capsule, Take 1 capsule (20 mg total) by mouth daily., Disp: 30 capsule, Rfl: 3 .  sildenafil (VIAGRA) 100 MG tablet, Take 0.5-1 tablets (50-100 mg total) by mouth daily as needed for erectile dysfunction., Disp: 10 tablet, Rfl: 5 .  venlafaxine XR (EFFEXOR XR) 75 MG 24 hr capsule, Take 3 capsules (225 mg total) by mouth daily with breakfast., Disp: 90 capsule, Rfl: 3 .  amitriptyline (ELAVIL) 10 MG tablet, Take 1 tablet (10 mg total) by mouth at bedtime., Disp: 30 tablet, Rfl: 2  No Known Allergies   ROS  Constitutional: Negative for fever ,  she has weight change.  Respiratory: Negative for cough and shortness of breath.   Cardiovascular: Negative for chest pain or palpitations.  Gastrointestinal: Negative for abdominal pain, no bowel changes.  Musculoskeletal: Negative for gait problem or joint swelling.  Skin: Negative for rash.  Neurological: Negative for dizziness or headache.  No other specific complaints in a complete review of systems (except as listed in HPI above).  Objective  Filed Vitals:   09/11/15 1208  BP: 134/86  Pulse: 91  Temp: 97.9 F (36.6 C)  TempSrc: Oral  Resp: 18  Height: 5\' 10"  (1.778 m)  Weight: 329 lb 3.2 oz (149.324 kg)  SpO2: 96%    Body mass index is 47.24 kg/(m^2).  Physical  Exam  Constitutional: Patient appears well-developed , obese. No distress.  HENT: Head: Normocephalic and atraumatic. Ears: B TMs ok, no erythema or effusion; Nose: Nose normal. Mouth/Throat: Oropharynx is clear and moist. No oropharyngeal exudate.  Eyes: Conjunctivae and EOM are normal. Pupils are equal, round, and reactive to light. No scleral icterus.  Neck: Normal range of motion. Neck supple. No JVD present. No thyromegaly present.  Cardiovascular: Normal rate, regular rhythm and normal heart sounds.  No murmur heard. No BLE edema. Pulmonary/Chest: Effort normal and breath sounds normal. No respiratory distress. Abdominal: Soft. Bowel sounds are normal, no distension. There is no tenderness. no masses MALE GENITALIA: Normal descended testes bilaterally, no masses palpated, no hernias, no lesions, no discharge, very small penis secondary to obesity RECTAL: Prostate normal size and consistency, no rectal masses or hemorrhoids Musculoskeletal: Normal range of motion, no joint effusions. No gross deformities Neurological: he is alert and oriented to person, place, and time. No cranial nerve deficit. Coordination, balance, strength, speech and gait are normal. He has pain/hypersensitive like a burning sensation around T10 dermatome of left back Skin: Skin is warm and dry. No rash noted. No erythema.  Psychiatric: Patient has a normal mood and affect. behavior is normal. Judgment and thought content normal.  Recent Results (from the past 2160 hour(s))  POCT HgB A1C     Status: Abnormal   Collection Time: 08/28/15  8:29 AM  Result Value Ref Range   Hemoglobin A1C 6.2   POCT UA - Microalbumin     Status: Abnormal   Collection Time: 08/28/15  8:29 AM  Result Value Ref Range   Microalbumin Ur, POC 20 mg/L   Creatinine, POC  mg/dL   Albumin/Creatinine Ratio, Urine, POC    POCT Urinalysis Dipstick     Status: Normal   Collection Time: 08/28/15  9:21 AM  Result Value Ref Range   Color, UA amber     Clarity, UA clear    Glucose, UA neg    Bilirubin, UA neg    Ketones, UA neg    Spec Grav, UA 1.015    Blood, UA neg    pH, UA 5.0    Protein, UA neg    Urobilinogen, UA negative    Nitrite, UA neg    Leukocytes, UA Negative Negative    PHQ2/9: Depression screen [facility] 2/9 08/28/2015 04/24/2015  Decreased Interest 0 2  Down, Depressed, Hopeless 0 0  PHQ - 2 Score 0 2  Altered sleeping - 0  Tired, decreased energy - 2  Change in appetite - 0  Feeling bad or failure about yourself  - 3  Trouble concentrating - 0  Moving slowly or fidgety/restless - 0  Suicidal thoughts - 0  PHQ-9 Score - 7  Difficult doing  work/chores - Not difficult at all     Fall Risk: Fall Risk  08/28/2015 04/24/2015  Falls in the past year? No No    Assessment & Plan  1. Encounter for routine history and physical exam for male  Discussed importance of 150 minutes of physical activity weekly, eat two servings of fish weekly, eat one serving of tree nuts ( cashews, pistachios, pecans, almonds.Marland Kitchen) every other day, eat 6 servings of fruit/vegetables daily and drink plenty of water and avoid sweet beverages.   2. Need for pneumococcal vaccination  - Pneumococcal conjugate vaccine 13-valent IM  3. Hyperalgesia  Started over one month ago, discussed on his last visit, never had a rash in the area, discussed possible herniated disc, neuralgia, possible shingles without the rash, but since over one month we will not give him valtrex at this time, return if no improvement, we will try Elavil  - amitriptyline (ELAVIL) 10 MG tablet; Take 1 tablet (10 mg total) by mouth at bedtime.  Dispense: 30 tablet; Refill: 2

## 2015-10-07 ENCOUNTER — Other Ambulatory Visit: Payer: Self-pay | Admitting: Family Medicine

## 2015-11-14 ENCOUNTER — Telehealth: Payer: Self-pay

## 2015-11-14 MED ORDER — METFORMIN HCL ER 500 MG PO TB24: 500.0000 mg | ORAL_TABLET | Freq: Every day | ORAL | Status: DC

## 2015-11-14 NOTE — Telephone Encounter (Signed)
done

## 2015-11-14 NOTE — Telephone Encounter (Signed)
Pharmacy faxed [place] a note stating Insurance no longer wants to cover Fortamet 500 mg but does prefer generic Glucophage XR (metformin ER). The pharmacy wanted to know if you could switch due to coverage. Thanks

## 2015-12-29 ENCOUNTER — Encounter: Payer: Self-pay | Admitting: Family Medicine

## 2015-12-29 ENCOUNTER — Ambulatory Visit (INDEPENDENT_AMBULATORY_CARE_PROVIDER_SITE_OTHER): Payer: Managed Care, Other (non HMO) | Admitting: Family Medicine

## 2015-12-29 VITALS — BP 124/80 | HR 84 | Temp 97.9°F | Resp 16 | Ht 70.0 in | Wt 330.4 lb

## 2015-12-29 DIAGNOSIS — K219 Gastro-esophageal reflux disease without esophagitis: Secondary | ICD-10-CM

## 2015-12-29 DIAGNOSIS — E785 Hyperlipidemia, unspecified: Secondary | ICD-10-CM

## 2015-12-29 DIAGNOSIS — E1169 Type 2 diabetes mellitus with other specified complication: Secondary | ICD-10-CM

## 2015-12-29 DIAGNOSIS — G4733 Obstructive sleep apnea (adult) (pediatric): Secondary | ICD-10-CM

## 2015-12-29 DIAGNOSIS — M5414 Radiculopathy, thoracic region: Secondary | ICD-10-CM

## 2015-12-29 DIAGNOSIS — F330 Major depressive disorder, recurrent, mild: Secondary | ICD-10-CM | POA: Diagnosis not present

## 2015-12-29 LAB — POCT GLYCOSYLATED HEMOGLOBIN (HGB A1C): Hemoglobin A1C: 6.7

## 2015-12-29 MED ORDER — ATORVASTATIN CALCIUM 40 MG PO TABS: 40.0000 mg | ORAL_TABLET | Freq: Every day | ORAL | Status: DC

## 2015-12-29 MED ORDER — VENLAFAXINE HCL ER 75 MG PO CP24: 225.0000 mg | ORAL_CAPSULE | Freq: Every day | ORAL | Status: DC

## 2015-12-29 MED ORDER — METFORMIN HCL ER 750 MG PO TB24: 750.0000 mg | ORAL_TABLET | Freq: Every day | ORAL | Status: DC

## 2015-12-29 NOTE — Progress Notes (Signed)
Name: Jimmy Bryant   MRN: [id number]    DOB: 1971-05-13   Date:12/29/2015       Progress Note  Subjective  Chief Complaint  Chief Complaint  Patient presents with  . Diabetes    patient is here for his 31-month f/u. wife checks his bs daily and keeps a log .  Marland Kitchen Flank Pain    patient stated that painis gone and he is doing much better.    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 Taking Metformin and denies side effects, hgbA1C has gone up from 6.2 % to 6.7% . Taking aspirin daily. Eye exam is scheduled for next week  Hyperlipidemia: he is taking Atorvastatin and states the muscle cramps resolved with increase in water intake.    Obesity: he gained 1 lb since last visit, discussed better food choices on fast food places. Gave him diet on a fast lane  ED: he has been taking generic Viagra, but is more expensive than the brand medication since he needs take 3 daily. He states it works well to initiate and erection, but erection does not stay for long  OSA: using CPAP machine every night, all night. Feels more rested, he has also noticed that he can dream now.   Back pain: he has noticed some pain/discomfort on left mid to lower back, when he sits down either to watch TV or drive, pain resolves when he shifts in his seat. He is doing very well on Elavil prn now.  Major Depression; doing well on 3 pills of Effexor daily, states symptoms of depression present since childhood. He is feeling well at this time, normal level of energy, able to focus better and no suicidal thoughts or ideation   Patient Active Problem List   Diagnosis Date Noted  . Mild episode of recurrent major depressive disorder ([place]) 12/29/2015  . Diabetes mellitus type II, controlled ([place]) 04/30/2015  . Circadian rhythm sleep disorder, shift work type 04/23/2015  .  Dyslipidemia 04/23/2015  . H/O lumbosacral spine surgery 04/23/2015  . H/O umbilical hernia repair 123456  . Dysmetabolic syndrome 123456  . Extreme obesity ([place]) 04/23/2015  . ED (erectile dysfunction) of organic origin 04/23/2015  . Obstructive apnea 04/23/2015  . Vitamin D deficiency 04/23/2015  . Decreased testosterone level 04/23/2015  . Gastro-esophageal reflux disease without esophagitis 08/22/2009    Past Surgical History  Procedure Laterality Date  . Umbilical hernia repair  07/10/2011  . Microdecompression lumbar spine  02/2011    L4-5 and L5-S1 by Dr. Babette Relic    Family History  Problem Relation Age of Onset  . Diabetes Mother   . Hypertension Father   . CAD Father   . Hypertension Sister   . Hypertension Daughter     Social History   Social History  . Marital Status: Married    Spouse Name: N/A  . Number of Children: N/A  . Years of Education: N/A   Occupational History  . Not on file.   Social History Main Topics  . Smoking status: Former Smoker -- 1.00 packs/day for 31 years    Types: Cigarettes    Start date: 10/28/1980    Quit date: 06/28/2012  . Smokeless tobacco: Never Used  . Alcohol Use: 0.0 oz/week    0 Standard drinks or equivalent per  week     Comment: rarely-a beer a month  . Drug Use: No  . Sexual Activity:    Partners: Female   Other Topics Concern  . Not on file   Social History Narrative     Current outpatient prescriptions:  .  amitriptyline (ELAVIL) 10 MG tablet, Take 1 tablet (10 mg total) by mouth at bedtime., Disp: 30 tablet, Rfl: 2 .  aspirin 81 MG tablet, Take 1 tablet by mouth daily., Disp: , Rfl:  .  atorvastatin (LIPITOR) 40 MG tablet, Take 1 tablet (40 mg total) by mouth daily., Disp: 30 tablet, Rfl: 3 .  Cholecalciferol (VITAMIN D3) 2000 UNITS capsule, Take 1 capsule by mouth daily., Disp: , Rfl:  .  ibuprofen (ADVIL,MOTRIN) 200 MG tablet, Take 2 tablets by mouth as needed., Disp: , Rfl:  .  metFORMIN  (GLUCOPHAGE-XR) 750 MG 24 hr tablet, Take 1 tablet (750 mg total) by mouth daily with breakfast., Disp: 30 tablet, Rfl: 3 .  omeprazole (PRILOSEC) 20 MG capsule, TAKE ONE CAPSULE BY MOUTH ONCE DAILY, Disp: 30 capsule, Rfl: 5 .  sildenafil (VIAGRA) 100 MG tablet, Take 0.5-1 tablets (50-100 mg total) by mouth daily as needed for erectile dysfunction., Disp: 10 tablet, Rfl: 5 .  venlafaxine XR (EFFEXOR XR) 75 MG 24 hr capsule, Take 3 capsules (225 mg total) by mouth daily with breakfast., Disp: 90 capsule, Rfl: 3  No Known Allergies   ROS  Constitutional: Negative for fever or significant weight change.  Respiratory: Negative for cough and shortness of breath.   Cardiovascular: Negative for chest pain or palpitations.  Gastrointestinal: Negative for abdominal pain, no bowel changes.  Musculoskeletal: Negative for gait problem or joint swelling.  Skin: Negative for rash.  Neurological: Negative for dizziness or headache.  No other specific complaints in a complete review of systems (except as listed in HPI above).  Objective  Filed Vitals:   12/29/15 0903  BP: 124/80  Pulse: 84  Temp: 97.9 F (36.6 C)  TempSrc: Oral  Resp: 16  Height: 5\' 10"  (1.778 m)  Weight: 330 lb 6.4 oz (149.868 kg)  SpO2: 98%    Body mass index is 47.41 kg/(m^2).  Physical Exam  Constitutional: Patient appears well-developed and well-nourished. Obese No distress.  HEENT: head atraumatic, normocephalic, pupils equal and reactive to light,  neck supple, throat within normal limits Cardiovascular: Normal rate, regular rhythm and normal heart sounds.  No murmur heard. No BLE edema. Pulmonary/Chest: Effort normal and breath sounds normal. No respiratory distress. Abdominal: Soft.  There is no tenderness. Psychiatric: Patient has a normal mood and affect. behavior is normal. Judgment and thought content normal. Muscular Skeletal: normal exam today  Recent Results (from the past 2160 hour(s))  POCT  glycosylated hemoglobin (Hb A1C)     Status: Abnormal   Collection Time: 12/29/15  9:35 AM  Result Value Ref Range   Hemoglobin A1C 6.7      PHQ2/9: Depression screen [facility] 2/9 12/29/2015 08/28/2015 04/24/2015  Decreased Interest 0 0 2  Down, Depressed, Hopeless 0 0 0  PHQ - 2 Score 0 0 2  Altered sleeping - - 0  Tired, decreased energy - - 2  Change in appetite - - 0  Feeling bad or failure about yourself  - - 3  Trouble concentrating - - 0  Moving slowly or fidgety/restless - - 0  Suicidal thoughts - - 0  PHQ-9 Score - - 7  Difficult doing work/chores - - Not difficult at all  Fall Risk: Fall Risk  12/29/2015 08/28/2015 04/24/2015  Falls in the past year? No No No    Functional Status Survey: Is the patient deaf or have difficulty hearing?: No Does the patient have difficulty seeing, even when wearing glasses/contacts?: No Does the patient have difficulty concentrating, remembering, or making decisions?: No Does the patient have difficulty walking or climbing stairs?: No Does the patient have difficulty dressing or bathing?: No Does the patient have difficulty doing errands alone such as visiting a doctor's office or shopping?: No    Assessment & Plan  1. Controlled type 2 diabetes mellitus with other specified complication ([place])  - POCT glycosylated hemoglobin (Hb A1C) - metFORMIN (GLUCOPHAGE-XR) 750 MG 24 hr tablet; Take 1 tablet (750 mg total) by mouth daily with breakfast.  Dispense: 30 tablet; Refill: 3  2. Obstructive apnea  Continue compliance with CPAP   3. Dyslipidemia  - atorvastatin (LIPITOR) 40 MG tablet; Take 1 tablet (40 mg total) by mouth daily.  Dispense: 30 tablet; Refill: 3  4. Morbid obesity due to excess calories [facility])  Discussed with the patient the risk posed by an increased BMI. Discussed importance of portion control, calorie counting and at least 150 minutes of physical activity weekly. Avoid sweet beverages and drink more water. Eat at  least 6 servings of fruit and vegetables daily  Son may have Celiac disease and they will change their diet  5. Gastroesophageal reflux disease without esophagitis  Feels bloated but stable otherwise on Prilosec  6. Mild episode of recurrent major depressive disorder (HCC)  - venlafaxine XR (EFFEXOR XR) 75 MG 24 hr capsule; Take 3 capsules (225 mg total) by mouth daily with breakfast.  Dispense: 90 capsule; Refill: 3  7. Radiculitis, thoracic  Continue Elavil prn now and is doing well

## 2016-03-12 IMAGING — CR DG CHEST 2V
1 series · 2 of 2 positions shown · non-contrast
Comparison: None.

CLINICAL DATA: Two weeks of productive cough ; shortness of breath
and dyspnea on exertion

EXAM:
CHEST  2 VIEW

[Series 1: kdxr chest pa (or ap) and lat · 0.14mm/px · 2 of 2 slices shown]
[im 1/2]
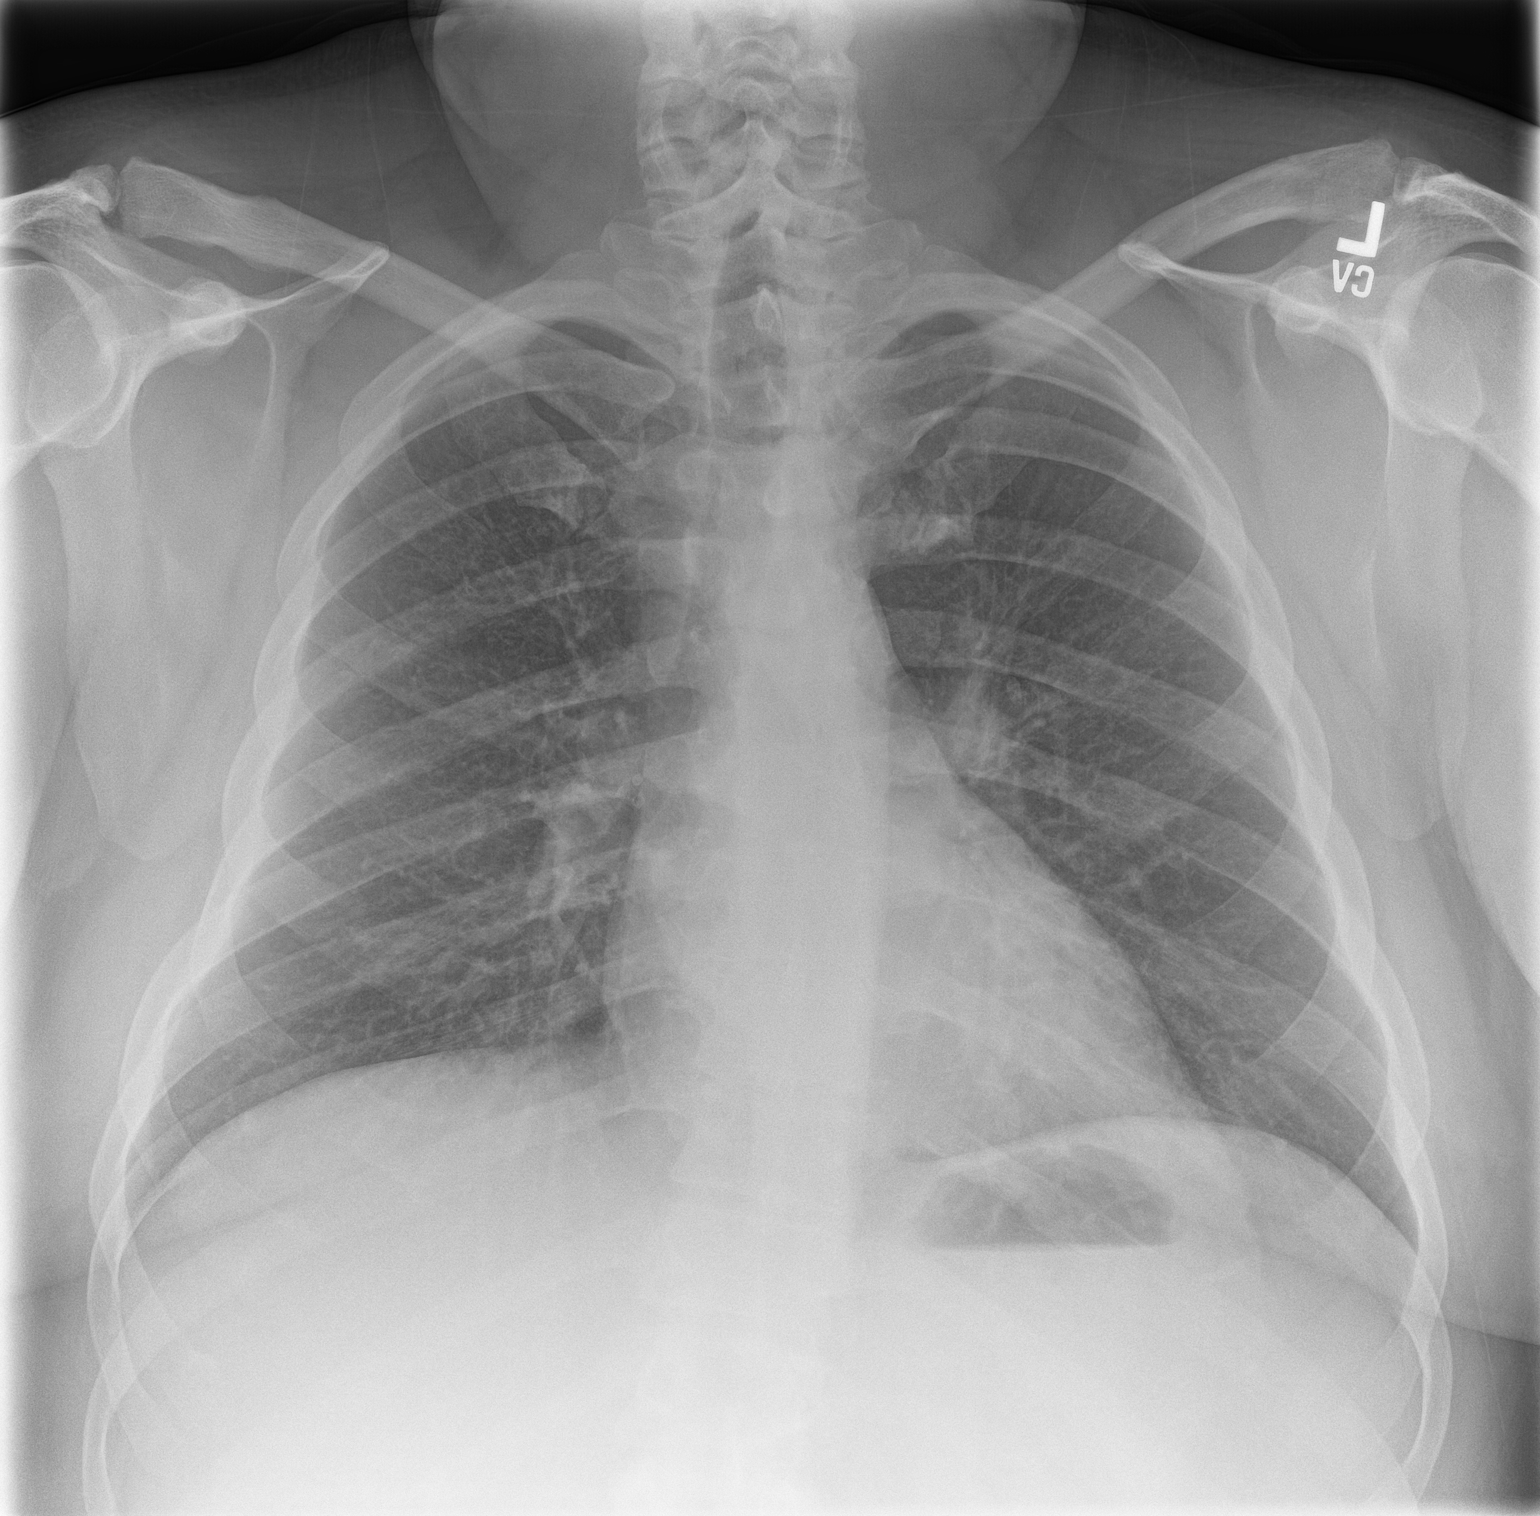
[im 2/2]
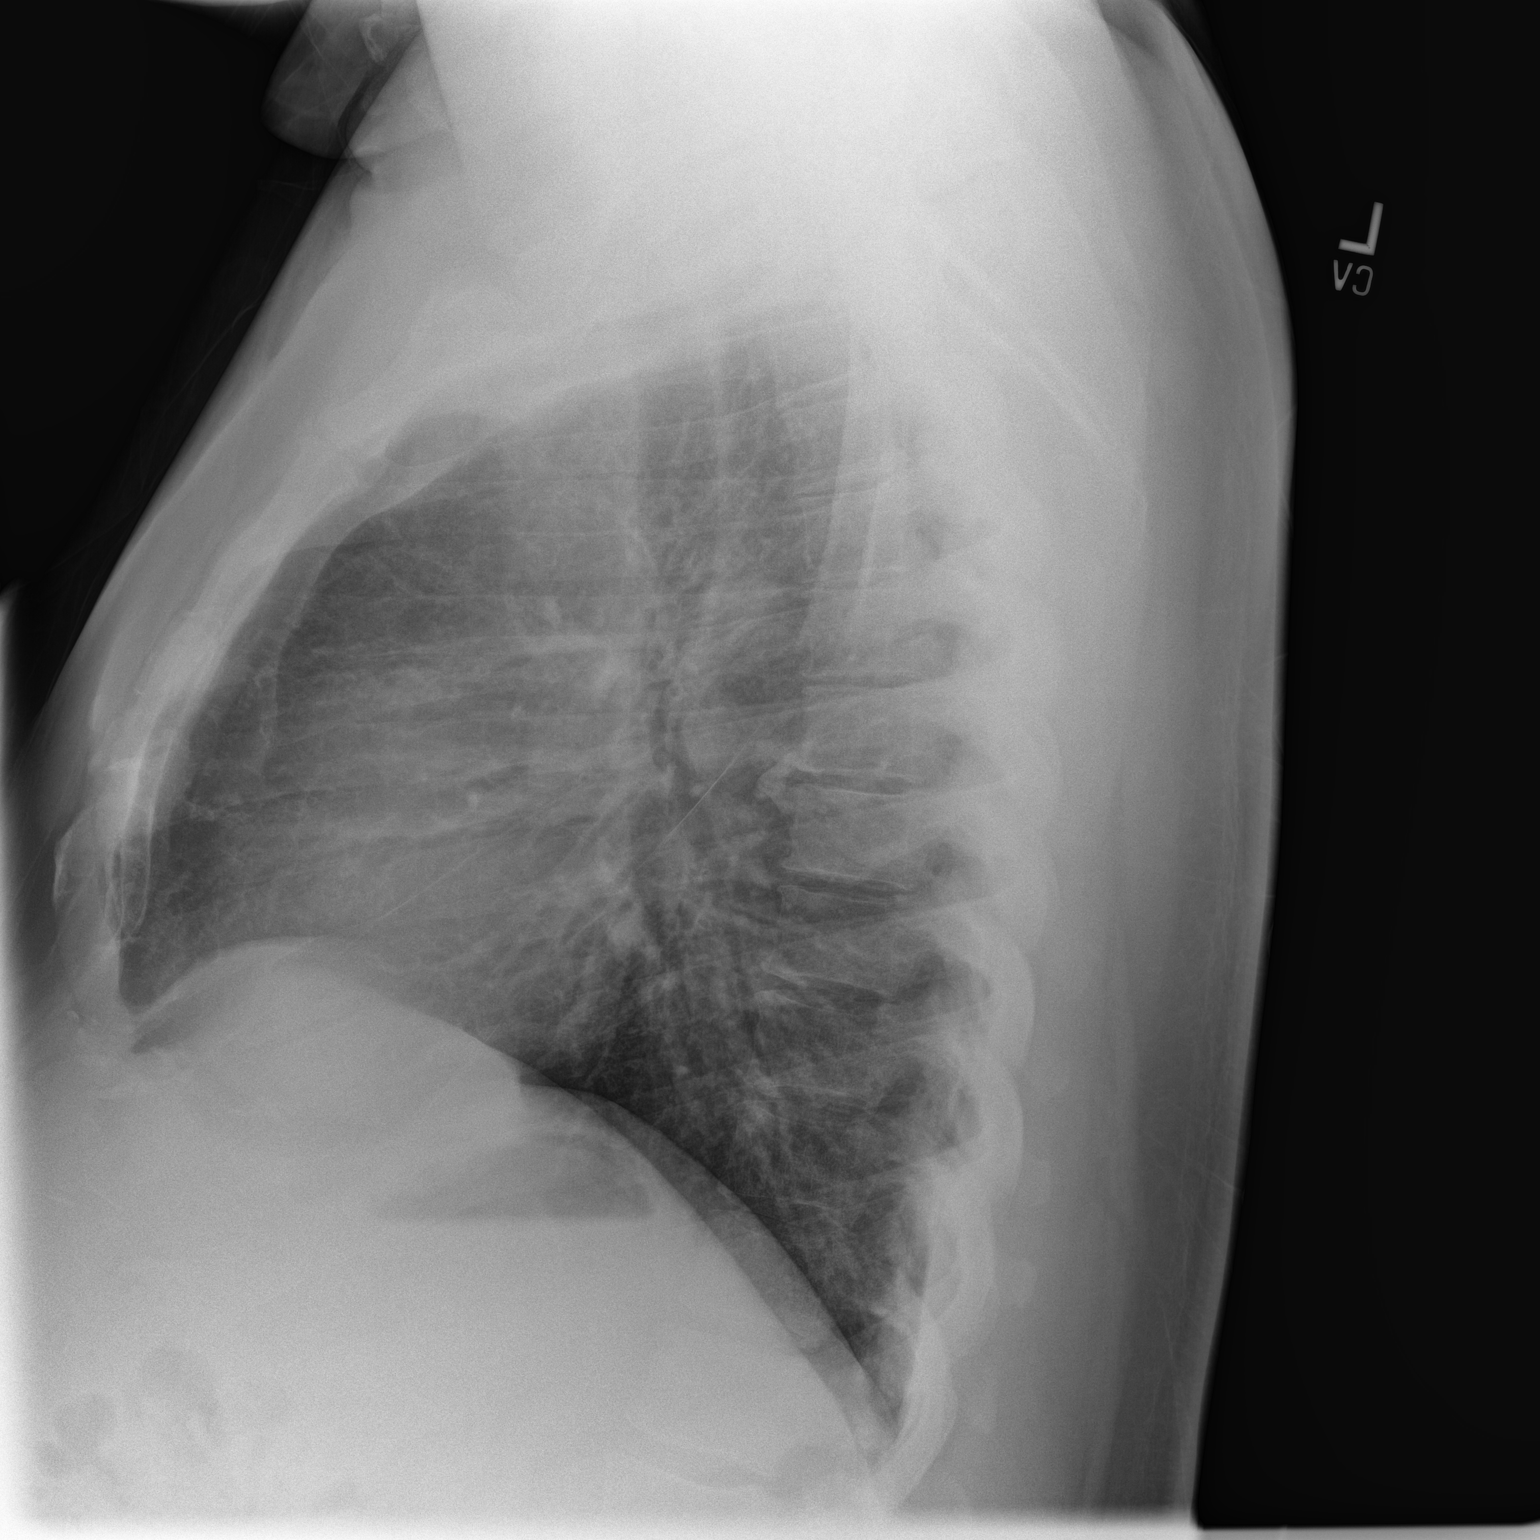

[2 of 2 positions shown; findings below may reference images not displayed]

FINDINGS: The lungs are adequately inflated. There is no focal infiltrate. The
interstitial markings are mildly prominent bilaterally though this
may be normal for the patient. The heart and pulmonary vascularity
are normal. The mediastinum is normal in width. There is no pleural
effusion or pneumothorax. The bony thorax exhibits no acute
abnormality. There are degenerative changes of the right AC joint.
IMPRESSION: There is no focal pneumonia. Mild interstitial prominence
bilaterally may reflect a smoking history if any. Very early
interstitial infiltrates are not excluded.

Follow-up films are recommended following anticipated antibiotic
therapy if the patient's symptoms persist.

## 2016-04-05 ENCOUNTER — Ambulatory Visit: Payer: Managed Care, Other (non HMO) | Admitting: Family Medicine

## 2016-04-11 ENCOUNTER — Other Ambulatory Visit: Payer: Self-pay | Admitting: Family Medicine

## 2016-04-11 MED ORDER — CITALOPRAM HYDROBROMIDE 40 MG PO TABS: 40.0000 mg | ORAL_TABLET | Freq: Every day | ORAL | Status: DC

## 2016-04-11 NOTE — Progress Notes (Signed)
Per patient request we will change from Effexor to Citalopram. He lost his job and needs to have the [place] discounted medication list.  We Will try Citalopram, he should take half of Citalopram for the next two days with Effexor and try to wean off Effexor, he will feel odd for the next week

## 2016-04-11 NOTE — Progress Notes (Signed)
I tried to contact this patient to inform him of Dr. Ancil Boozer' message but there was no answer. A message was left for this patient with her specific instruction and he was encouraged to give [place] a call if her had any questions or concerns.  LR

## 2016-05-13 ENCOUNTER — Other Ambulatory Visit: Payer: Self-pay | Admitting: Family Medicine

## 2016-05-13 NOTE — Telephone Encounter (Signed)
Patient requesting refill. 

## 2016-05-31 ENCOUNTER — Ambulatory Visit (INDEPENDENT_AMBULATORY_CARE_PROVIDER_SITE_OTHER): Payer: BLUE CROSS/BLUE SHIELD | Admitting: Family Medicine

## 2016-05-31 ENCOUNTER — Encounter: Payer: Self-pay | Admitting: Family Medicine

## 2016-05-31 VITALS — BP 128/80 | HR 71 | Temp 97.9°F | Resp 18 | Ht 70.0 in | Wt 315.5 lb

## 2016-05-31 DIAGNOSIS — K219 Gastro-esophageal reflux disease without esophagitis: Secondary | ICD-10-CM

## 2016-05-31 DIAGNOSIS — E785 Hyperlipidemia, unspecified: Secondary | ICD-10-CM

## 2016-05-31 DIAGNOSIS — G4733 Obstructive sleep apnea (adult) (pediatric): Secondary | ICD-10-CM

## 2016-05-31 DIAGNOSIS — Z79899 Other long term (current) drug therapy: Secondary | ICD-10-CM | POA: Diagnosis not present

## 2016-05-31 DIAGNOSIS — F330 Major depressive disorder, recurrent, mild: Secondary | ICD-10-CM | POA: Diagnosis not present

## 2016-05-31 DIAGNOSIS — E1169 Type 2 diabetes mellitus with other specified complication: Secondary | ICD-10-CM | POA: Diagnosis not present

## 2016-05-31 LAB — COMPLETE METABOLIC PANEL WITH GFR
ALT: 34 U/L (ref 9–46)
AST: 21 U/L (ref 10–40)
Albumin: 4.3 g/dL (ref 3.6–5.1)
Alkaline Phosphatase: 71 U/L (ref 40–115)
BUN: 16 mg/dL (ref 7–25)
CO2: 30 mmol/L (ref 20–31)
Calcium: 9.3 mg/dL (ref 8.6–10.3)
Chloride: 102 mmol/L (ref 98–110)
Creat: 1 mg/dL (ref 0.60–1.35)
GFR, Est African American: >89 mL/min (ref >=60)
GFR, Est Non African American: >89 mL/min (ref >=60)
Glucose, Bld: 107 mg/dL — ABNORMAL HIGH (ref 65–99)
Potassium: 4.6 mmol/L (ref 3.5–5.3)
Sodium: 139 mmol/L (ref 135–146)
Total Bilirubin: 0.6 mg/dL (ref 0.2–1.2)
Total Protein: 7.3 g/dL (ref 6.1–8.1)

## 2016-05-31 LAB — LIPID PANEL
Cholesterol: 121 mg/dL — ABNORMAL LOW (ref 125–200)
HDL: 30 mg/dL — ABNORMAL LOW (ref >=40)
LDL Cholesterol: 67 mg/dL (ref <130)
Total CHOL/HDL Ratio: 4 Ratio (ref <=5.0)
Triglycerides: 118 mg/dL (ref <150)
VLDL: 24 mg/dL (ref <30)

## 2016-05-31 LAB — POCT GLYCOSYLATED HEMOGLOBIN (HGB A1C): Hemoglobin A1C: 6.1

## 2016-05-31 LAB — GLUCOSE, POCT (MANUAL RESULT ENTRY): POC Glucose: 111 mg/dl — AB (ref 70–99)

## 2016-05-31 MED ORDER — METFORMIN HCL ER 750 MG PO TB24: 750.0000 mg | ORAL_TABLET | Freq: Every day | ORAL | 3 refills | Status: DC

## 2016-05-31 MED ORDER — OMEPRAZOLE 20 MG PO CPDR: 20.0000 mg | DELAYED_RELEASE_CAPSULE | Freq: Every day | ORAL | 5 refills | Status: DC

## 2016-05-31 MED ORDER — ATORVASTATIN CALCIUM 40 MG PO TABS: 40.0000 mg | ORAL_TABLET | Freq: Every day | ORAL | 3 refills | Status: DC

## 2016-05-31 NOTE — Addendum Note (Signed)
Addended by: Steele Sizer F on: 05/31/2016 09:22 AM   Modules accepted: Orders

## 2016-05-31 NOTE — Progress Notes (Signed)
Name: Jimmy Bryant   MRN: [id number]    DOB: June 11, 1971   Date:05/31/2016       Progress Note  Subjective  Chief Complaint  Chief Complaint  Patient presents with  . Medication Refill  . Diabetes  . Hyperlipidemia    HPI  DM with ED: he has been more compliant on his diet. He is off sweets sodas, taking Sprite Zero  No polydipsia , polyuria or polyphagia. Taking Metformin and denies side effects, hgbA1C has gone up from 6.2 % to 6.7% and down to 6.1% . Taking aspirin daily. 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 Eating dinner at home, packing healthier snacks, more active on his job and has lost 15 lbs since last visit.   ED: he was taking Viagra but did not work and too expensive. He saw Urologist and was told to lose weight.   OSA: using CPAP machine every night, all night. Feels more rested, he has also noticed that he can dream now, no longer on Elavil   Major Depression: he is on Celexa, he has noticed he is a little snappy, but denies sadness or lack of energy. He used to take Effexor, he likes the current medication better and does not want to change it.    Patient Active Problem List   Diagnosis Date Noted  . Mild episode of recurrent major depressive disorder ([place]) 12/29/2015  . Diabetes mellitus type II, controlled ([place]) 04/30/2015  . Circadian rhythm sleep disorder, shift work type 04/23/2015  . Dyslipidemia 04/23/2015  . H/O lumbosacral spine surgery 04/23/2015  . H/O umbilical hernia repair 123456  . Dysmetabolic syndrome 123456  . Extreme obesity ([place]) 04/23/2015  . ED (erectile dysfunction) of organic origin 04/23/2015  . Obstructive apnea 04/23/2015  . Vitamin D deficiency 04/23/2015  . Decreased testosterone level 04/23/2015  . Gastro-esophageal reflux  disease without esophagitis 08/22/2009    Past Surgical History:  Procedure Laterality Date  . microdecompression lumbar spine  02/2011   L4-5 and L5-S1 by Dr. Babette Relic  . UMBILICAL HERNIA REPAIR  07/10/2011    Family History  Problem Relation Age of Onset  . Diabetes Mother   . Hypertension Father   . CAD Father   . Hypertension Sister   . Hypertension Daughter     Social History   Social History  . Marital status: Married    Spouse name: N/A  . Number of children: N/A  . Years of education: N/A   Occupational History  . Not on file.   Social History Main Topics  . Smoking status: Former Smoker    Packs/day: 1.00    Years: 31.00    Types: Cigarettes    Start date: 10/28/1980    Quit date: 06/28/2012  . Smokeless tobacco: Never Used  . Alcohol use 0.0 oz/week     Comment: rarely-a beer a month  . Drug use: No  . Sexual activity: Yes    Partners: Female   Other Topics Concern  . Not on file   Social History Narrative  . No narrative on file     Current Outpatient Prescriptions:  .  aspirin 81 MG tablet, Take 1 tablet by mouth daily., Disp: , Rfl:  .  atorvastatin (LIPITOR) 40 MG tablet, Take 1 tablet (40  mg total) by mouth daily., Disp: 30 tablet, Rfl: 3 .  Cholecalciferol (VITAMIN D3) 2000 UNITS capsule, Take 1 capsule by mouth daily., Disp: , Rfl:  .  citalopram (CELEXA) 40 MG tablet, Take 1 tablet (40 mg total) by mouth daily., Disp: 30 tablet, Rfl: 3 .  ibuprofen (ADVIL,MOTRIN) 200 MG tablet, Take 2 tablets by mouth as needed., Disp: , Rfl:  .  metFORMIN (GLUCOPHAGE-XR) 750 MG 24 hr tablet, Take 1 tablet (750 mg total) by mouth daily with breakfast., Disp: 30 tablet, Rfl: 3 .  omeprazole (PRILOSEC) 20 MG capsule, Take 1 capsule (20 mg total) by mouth daily., Disp: 30 capsule, Rfl: 5  No Known Allergies   ROS  Ten systems reviewed and is negative except as mentioned in HPI   Objective  Vitals:   05/31/16 0836  BP: 128/80  Pulse: 71  Resp: 18   Temp: 97.9 F (36.6 C)  SpO2: 95%  Weight: (!) 315 lb 8 oz (143.1 kg)  Height: 5\' 10"  (1.778 m)    Body mass index is 45.27 kg/m.  Physical Exam  Constitutional: Patient appears well-developed and well-nourished. Obese No distress.  HEENT: head atraumatic, normocephalic, pupils equal and reactive to light, neck supple, throat within normal limits Cardiovascular: Normal rate, regular rhythm and normal heart sounds.  No murmur heard. No BLE edema. Pulmonary/Chest: Effort normal and breath sounds normal. No respiratory distress. Abdominal: Soft.  There is no tenderness. Psychiatric: Patient has a normal mood and affect. behavior is normal. Judgment and thought content normal.  Recent Results (from the past 2160 hour(s))  POCT HgB A1C     Status: None   Collection Time: 05/31/16  8:51 AM  Result Value Ref Range   Hemoglobin A1C 6.1   POCT Glucose (CBG)     Status: Abnormal   Collection Time: 05/31/16  8:51 AM  Result Value Ref Range   POC Glucose 111 (A) 70 - 99 mg/dl    Diabetic Foot Exam: Diabetic Foot Exam - Simple   Simple Foot Form Diabetic Foot exam was performed with the following findings:  Yes 05/31/2016  9:20 AM  Visual Inspection No deformities, no ulcerations, no other skin breakdown bilaterally:  Yes Sensation Testing Intact to touch and monofilament testing bilaterally:  Yes Pulse Check Posterior Tibialis and Dorsalis pulse intact bilaterally:  Yes Comments      PHQ2/9: Depression screen [facility] 2/9 12/29/2015 08/28/2015 04/24/2015  Decreased Interest 0 0 2  Down, Depressed, Hopeless 0 0 0  PHQ - 2 Score 0 0 2  Altered sleeping - - 0  Tired, decreased energy - - 2  Change in appetite - - 0  Feeling bad or failure about yourself  - - 3  Trouble concentrating - - 0  Moving slowly or fidgety/restless - - 0  Suicidal thoughts - - 0  PHQ-9 Score - - 7  Difficult doing work/chores - - Not difficult at all    Fall Risk: Fall Risk  12/29/2015 08/28/2015  04/24/2015  Falls in the past year? No No No    Assessment & Plan  1. Controlled type 2 diabetes mellitus with other specified complication, without long-term current use of insulin (HCC)  - POCT HgB A1C - POCT Glucose (CBG) - metFORMIN (GLUCOPHAGE-XR) 750 MG 24 hr tablet; Take 1 tablet (750 mg total) by mouth daily with breakfast.  Dispense: 30 tablet; Refill: 3  2. Obstructive apnea  Continue CPAP every night  3. Gastroesophageal reflux disease without esophagitis  - omeprazole (  PRILOSEC) 20 MG capsule; Take 1 capsule (20 mg total) by mouth daily.  Dispense: 30 capsule; Refill: 5  4. Dyslipidemia  - atorvastatin (LIPITOR) 40 MG tablet; Take 1 tablet (40 mg total) by mouth daily.  Dispense: 30 tablet; Refill: 3  5. Morbid obesity due to excess calories [facility])  Discussed with the patient the risk posed by an increased BMI. Discussed importance of portion control, calorie counting and at least 150 minutes of physical activity weekly. Avoid sweet beverages and drink more water. Eat at least 6 servings of fruit and vegetables daily   6. Mild episode of recurrent major depressive disorder (HCC)  Continue medication

## 2016-09-03 ENCOUNTER — Telehealth: Payer: Self-pay | Admitting: Family Medicine

## 2016-09-03 NOTE — Telephone Encounter (Signed)
Pt would like to go back using Citalopram instead of the other medication. Pt states Citalopram seems to work better for him. He is requesting a new refill be called into [place].

## 2016-09-03 NOTE — Telephone Encounter (Signed)
LMOM to inform pt °

## 2016-09-03 NOTE — Telephone Encounter (Signed)
Pt needs appointment has not been here since Aug and she will not change until pt is seen

## 2016-09-06 ENCOUNTER — Ambulatory Visit: Payer: BLUE CROSS/BLUE SHIELD | Admitting: Family Medicine

## 2016-09-07 ENCOUNTER — Other Ambulatory Visit: Payer: Self-pay | Admitting: Family Medicine

## 2016-09-13 ENCOUNTER — Encounter: Payer: Self-pay | Admitting: Family Medicine

## 2016-09-13 ENCOUNTER — Ambulatory Visit (INDEPENDENT_AMBULATORY_CARE_PROVIDER_SITE_OTHER): Payer: BLUE CROSS/BLUE SHIELD | Admitting: Family Medicine

## 2016-09-13 VITALS — BP 126/64 | HR 78 | Temp 98.1°F | Resp 16 | Ht 70.0 in | Wt 321.2 lb

## 2016-09-13 DIAGNOSIS — E668 Other obesity: Secondary | ICD-10-CM

## 2016-09-13 DIAGNOSIS — F330 Major depressive disorder, recurrent, mild: Secondary | ICD-10-CM

## 2016-09-13 DIAGNOSIS — E669 Obesity, unspecified: Secondary | ICD-10-CM

## 2016-09-13 DIAGNOSIS — Z23 Encounter for immunization: Secondary | ICD-10-CM

## 2016-09-13 DIAGNOSIS — E785 Hyperlipidemia, unspecified: Secondary | ICD-10-CM

## 2016-09-13 DIAGNOSIS — E1169 Type 2 diabetes mellitus with other specified complication: Secondary | ICD-10-CM | POA: Diagnosis not present

## 2016-09-13 DIAGNOSIS — R202 Paresthesia of skin: Secondary | ICD-10-CM

## 2016-09-13 DIAGNOSIS — E559 Vitamin D deficiency, unspecified: Secondary | ICD-10-CM

## 2016-09-13 LAB — POCT UA - MICROALBUMIN: Microalbumin Ur, POC: 20 mg/L

## 2016-09-13 LAB — POCT GLYCOSYLATED HEMOGLOBIN (HGB A1C): Hemoglobin A1C: 6.1

## 2016-09-13 MED ORDER — METFORMIN HCL ER 750 MG PO TB24: 750.0000 mg | ORAL_TABLET | Freq: Every day | ORAL | 1 refills | Status: DC

## 2016-09-13 MED ORDER — CITALOPRAM HYDROBROMIDE 40 MG PO TABS: 40.0000 mg | ORAL_TABLET | Freq: Every day | ORAL | 1 refills | Status: DC

## 2016-09-13 MED ORDER — ATORVASTATIN CALCIUM 40 MG PO TABS: 40.0000 mg | ORAL_TABLET | Freq: Every day | ORAL | 1 refills | Status: DC

## 2016-09-13 NOTE — Progress Notes (Signed)
Name: Jimmy Bryant   MRN: [id number]    DOB: 12-Sep-1971   Date:09/13/2016       Progress Note  Subjective  Chief Complaint  Chief Complaint  Patient presents with  . Diabetes  . Depression  . Medication Refill  . Flu Vaccine    HPI  DM with ED:He is back drinking sodas for breakfast and eating honey bun. He denies polydipsia , polyuria or polyphagia. Taking Metformin and denies side effects, hgbA1C has gone up from 6.2 % to 6.7% and down to 6.1% for the past 6 months . Taking aspirin daily. Eye exam is up to date   Hyperlipidemia: he is taking Atorvastatin and denies muscle pain, no chest pain   Obesity: he was doing well with job switch and dietary modification, but he is back on old habits and has gained 7 lbs since last visit, advised him to resume his life style changes  ED: he was taking Viagra but did not work and too expensive. He saw Urologist and was told to lose weight. Discussed Revation but he would like to hold off for now.  OSA: using CPAP machine every night, all night. Feels more rested, he has also noticed that he can dream now.  Major Depression: he is on Celexa ( he states he got a refill of Effexor  that was at the pharmacy and it made him angry and he is back on Celexa)  He states Celexa works well for him, no longer snappy and angry, it keeps his mood stable, no crying spells, no suicidal thoughts or ideation.   Left hand paresthesia: symptoms started about one month ago, usually happens while driving and he states he rests his left elbow on his door and the left hand ( 4th and 5th fingers ) are going numb intermittently, he rubs the fingers and improves symptoms  Patient Active Problem List   Diagnosis Date Noted  . Mild episode of recurrent major depressive disorder ([place]) 12/29/2015  . Diabetes mellitus type II, controlled ([place]) 04/30/2015  . Circadian rhythm sleep disorder, shift work type 04/23/2015  . Dyslipidemia 04/23/2015  . H/O lumbosacral  spine surgery 04/23/2015  . H/O umbilical hernia repair 123456  . Dysmetabolic syndrome 123456  . Extreme obesity ([place]) 04/23/2015  . ED (erectile dysfunction) of organic origin 04/23/2015  . Obstructive apnea 04/23/2015  . Vitamin D deficiency 04/23/2015  . Decreased testosterone level 04/23/2015  . Gastro-esophageal reflux disease without esophagitis 08/22/2009    Past Surgical History:  Procedure Laterality Date  . microdecompression lumbar spine  02/2011   L4-5 and L5-S1 by Dr. Babette Relic  . UMBILICAL HERNIA REPAIR  07/10/2011    Family History  Problem Relation Age of Onset  . Diabetes Mother   . Hypertension Father   . CAD Father   . Hypertension Sister   . Hypertension Daughter     Social History   Social History  . Marital status: Married    Spouse name: N/A  . Number of children: N/A  . Years of education: N/A   Occupational History  . Not on file.   Social History Main Topics  . Smoking status: Former Smoker    Packs/day: 1.00    Years: 31.00    Types: Cigarettes    Start date: 10/28/1980    Quit date: 06/28/2012  . Smokeless tobacco: Never Used  . Alcohol use 0.0 oz/week     Comment: rarely-a beer a month  . Drug use: No  . Sexual  activity: Yes    Partners: Female   Other Topics Concern  . Not on file   Social History Narrative  . No narrative on file     Current Outpatient Prescriptions:  .  aspirin 81 MG tablet, Take 1 tablet by mouth daily., Disp: , Rfl:  .  atorvastatin (LIPITOR) 40 MG tablet, Take 1 tablet (40 mg total) by mouth daily., Disp: 90 tablet, Rfl: 1 .  Cholecalciferol (VITAMIN D3) 2000 UNITS capsule, Take 1 capsule by mouth daily., Disp: , Rfl:  .  citalopram (CELEXA) 40 MG tablet, Take 1 tablet (40 mg total) by mouth daily., Disp: 90 tablet, Rfl: 1 .  ibuprofen (ADVIL,MOTRIN) 200 MG tablet, Take 2 tablets by mouth as needed., Disp: , Rfl:  .  metFORMIN (GLUCOPHAGE-XR) 750 MG 24 hr tablet, Take 1 tablet (750 mg total) by  mouth daily with breakfast., Disp: 90 tablet, Rfl: 1 .  omeprazole (PRILOSEC) 20 MG capsule, Take 1 capsule (20 mg total) by mouth daily., Disp: 30 capsule, Rfl: 5  Allergies  Allergen Reactions  . Effexor Xr [Venlafaxine Hcl] Other (See Comments)    Unstable moods     ROS  Constitutional: Negative for fever, positive for  weight change.  Respiratory: Negative for cough and shortness of breath.   Cardiovascular: Negative for chest pain or palpitations.  Gastrointestinal: Negative for abdominal pain, no bowel changes.  Musculoskeletal: Negative for gait problem or joint swelling.  Skin: Negative for rash.  Neurological: Negative for dizziness or headache.  No other specific complaints in a complete review of systems (except as listed in HPI above).  Objective  Vitals:   09/13/16 0910  BP: 126/64  Pulse: 78  Resp: 16  Temp: 98.1 F (36.7 C)  SpO2: 97%  Weight: (!) 321 lb 4 oz (145.7 kg)  Height: 5\' 10"  (1.778 m)    Body mass index is 46.09 kg/m.  Physical Exam  Constitutional: Patient appears well-developed and well-nourished. Obese No distress.  HEENT: head atraumatic, normocephalic, pupils equal and reactive to light,  neck supple, throat within normal limits Cardiovascular: Normal rate, regular rhythm and normal heart sounds.  No murmur heard. No BLE edema. Pulmonary/Chest: Effort normal and breath sounds normal. No respiratory distress. Abdominal: Soft.  There is no tenderness. Psychiatric: Patient has a normal mood and affect. behavior is normal. Judgment and thought content normal.   Recent Results (from the past 2160 hour(s))  POCT HgB A1C     Status: None   Collection Time: 09/13/16  9:14 AM  Result Value Ref Range   Hemoglobin A1C 6.1   POCT UA - Microalbumin     Status: Normal   Collection Time: 09/13/16  9:23 AM  Result Value Ref Range   Microalbumin Ur, POC 20 mg/L   Creatinine, POC  mg/dL   Albumin/Creatinine Ratio, Urine, POC        PHQ2/9: Depression screen [facility] 2/9 09/13/2016 12/29/2015 08/28/2015 04/24/2015  Decreased Interest 0 0 0 2  Down, Depressed, Hopeless 0 0 0 0  PHQ - 2 Score 0 0 0 2  Altered sleeping - - - 0  Tired, decreased energy - - - 2  Change in appetite - - - 0  Feeling bad or failure about yourself  - - - 3  Trouble concentrating - - - 0  Moving slowly or fidgety/restless - - - 0  Suicidal thoughts - - - 0  PHQ-9 Score - - - 7  Difficult doing work/chores - - - Not  difficult at all     Fall Risk: Fall Risk  09/13/2016 12/29/2015 08/28/2015 04/24/2015  Falls in the past year? No No No No    Functional Status Survey: Is the patient deaf or have difficulty hearing?: No Does the patient have difficulty seeing, even when wearing glasses/contacts?: No Does the patient have difficulty concentrating, remembering, or making decisions?: No Does the patient have difficulty walking or climbing stairs?: No Does the patient have difficulty dressing or bathing?: No Does the patient have difficulty doing errands alone such as visiting a doctor's office or shopping?: No    Assessment & Plan  1. Controlled type 2 diabetes mellitus with other specified complication, without long-term current use of insulin (HCC)  - POCT HgB A1C - POCT UA - Microalbumin - metFORMIN (GLUCOPHAGE-XR) 750 MG 24 hr tablet; Take 1 tablet (750 mg total) by mouth daily with breakfast.  Dispense: 90 tablet; Refill: 1  2. Need for influenza vaccination  - Flu Vaccine QUAD 36+ mos PF IM (Fluarix & Fluzone Quad PF)  3. Dyslipidemia  - atorvastatin (LIPITOR) 40 MG tablet; Take 1 tablet (40 mg total) by mouth daily.  Dispense: 90 tablet; Refill: 1  4. Mild episode of recurrent major depressive disorder (HCC)  - citalopram (CELEXA) 40 MG tablet; Take 1 tablet (40 mg total) by mouth daily.  Dispense: 90 tablet; Refill: 1  5. Vitamin D deficiency  Continue vitamin D supplementation   6. Extreme obesity ([place])  Stop  [employer], and Pam, avoid carbohydrates and drinking sodas for breakfast with honey-bun  7. Left hand paresthesia  Likely from ulnar nerve entrapment advised to stop resting left elbow on the door while driving and get NCS, but he would like to hold off for now.

## 2016-10-04 ENCOUNTER — Ambulatory Visit: Payer: BLUE CROSS/BLUE SHIELD | Admitting: Family Medicine

## 2016-12-13 ENCOUNTER — Encounter: Payer: Self-pay | Admitting: Family Medicine

## 2016-12-13 ENCOUNTER — Ambulatory Visit (INDEPENDENT_AMBULATORY_CARE_PROVIDER_SITE_OTHER): Payer: 59 | Admitting: Family Medicine

## 2016-12-13 VITALS — BP 118/64 | HR 78 | Temp 98.0°F | Resp 18 | Ht 70.0 in | Wt 321.3 lb

## 2016-12-13 DIAGNOSIS — E1140 Type 2 diabetes mellitus with diabetic neuropathy, unspecified: Secondary | ICD-10-CM

## 2016-12-13 DIAGNOSIS — K219 Gastro-esophageal reflux disease without esophagitis: Secondary | ICD-10-CM

## 2016-12-13 DIAGNOSIS — R202 Paresthesia of skin: Secondary | ICD-10-CM

## 2016-12-13 DIAGNOSIS — E785 Hyperlipidemia, unspecified: Secondary | ICD-10-CM

## 2016-12-13 DIAGNOSIS — F330 Major depressive disorder, recurrent, mild: Secondary | ICD-10-CM | POA: Diagnosis not present

## 2016-12-13 DIAGNOSIS — G4733 Obstructive sleep apnea (adult) (pediatric): Secondary | ICD-10-CM | POA: Diagnosis not present

## 2016-12-13 DIAGNOSIS — E559 Vitamin D deficiency, unspecified: Secondary | ICD-10-CM

## 2016-12-13 DIAGNOSIS — E668 Other obesity: Secondary | ICD-10-CM

## 2016-12-13 DIAGNOSIS — E669 Obesity, unspecified: Secondary | ICD-10-CM

## 2016-12-13 LAB — POCT GLYCOSYLATED HEMOGLOBIN (HGB A1C): Hemoglobin A1C: 6.1

## 2016-12-13 MED ORDER — OMEPRAZOLE 20 MG PO CPDR: 20.0000 mg | DELAYED_RELEASE_CAPSULE | Freq: Every day | ORAL | 1 refills | Status: DC

## 2016-12-13 MED ORDER — DULAGLUTIDE 1.5 MG/0.5ML ~~LOC~~ SOAJ: 1.5000 mg | SUBCUTANEOUS | 0 refills | Status: DC

## 2016-12-13 NOTE — Progress Notes (Signed)
61

## 2016-12-13 NOTE — Addendum Note (Signed)
Addended by: Lolita Rieger D on: 12/13/2016 10:20 AM   Modules accepted: Orders

## 2016-12-13 NOTE — Progress Notes (Signed)
Name: Jimmy Bryant   MRN: [id number]    DOB: [birth year]   Date:12/13/2016       Progress Note  Subjective  Chief Complaint  Chief Complaint  Patient presents with  . Diabetes  . Hyperlipidemia  . Depression    HPI  DM with ED:He is back drinking sodas for breakfast and eating honey bun. He denies polydipsia , polyuria or polyphagia. Taking Metformin and denies side effects, hgbA1C has gone up from 6.2 % to 6.7% and down to 6.1% for the past 6 months . Taking aspirin daily. Eye exam is up to date. Not on ED medication because of cost. We will try to add Trulicity to help with insulin resistance and help with weight loss.   Hyperlipidemia: he is taking Atorvastatin and denies muscle pain, no chest pain   Obesity: he was doing well with job switch and dietary modification,weight is now stable, still having sweet drinks and needs to be more pro-active  ED: he was taking Viagra but did not work and too expensive. He saw Urologist and was told to lose weight. Discussed Revation but he would like to hold off for now.  OSA: using CPAP machine every night, all night. Feels more rested, he has also noticed that he can dream now. He denies headaches when he wakes up or feeling sleepy during the day.   Major Depression: he is on Celexa ( he states he got a refill of Effexor  that was at the pharmacy and it made him angry and he is back on Celexa)  He states Celexa works well for him, no longer snappy and angry, it keeps his mood stable, no crying spells, no suicidal thoughts or ideation, he denies anhedonia. .   Left hand paresthesia: symptoms started about 4 months  usually happens while driving and he states he rests his left elbow on his door and the left hand ( 4th and 5th fingers ) are going numb intermittently, he rubs the fingers and improves symptoms. He states he is trying not to rest his left elbow on hard surfaces and is doing better now.   Patient Active Problem List   Diagnosis Date Noted  . Mild episode of recurrent major depressive disorder ([place]) 12/29/2015  . Diabetes mellitus type II, controlled ([place]) 04/30/2015  . Circadian rhythm sleep disorder, shift work type 04/23/2015  . Dyslipidemia 04/23/2015  . H/O lumbosacral spine surgery 04/23/2015  . H/O umbilical hernia repair 123456  . Dysmetabolic syndrome 123456  . Extreme obesity ([place]) 04/23/2015  . ED (erectile dysfunction) of organic origin 04/23/2015  . Obstructive apnea 04/23/2015  . Vitamin D deficiency 04/23/2015  . Decreased testosterone level 04/23/2015  . Gastro-esophageal reflux disease without esophagitis 08/22/2009    Past Surgical History:  Procedure Laterality Date  . microdecompression lumbar spine  02/2011   L4-5 and L5-S1 by Dr. Babette Relic  . UMBILICAL HERNIA REPAIR  07/10/2011    Family History  Problem Relation Age of Onset  . Diabetes Mother   . Hypertension Father   . CAD Father   . Hypertension Sister   . Hypertension Daughter     Social History   Social History  . Marital status: Married    Spouse name: N/A  . Number of children: N/A  . Years of education: N/A   Occupational History  . Not on file.   Social History Main Topics  . Smoking status: Former Smoker    Packs/day: 1.00    Years: 31.00  Types: Cigarettes    Start date: 10/28/1980    Quit date: 06/28/2012  . Smokeless tobacco: Never Used  . Alcohol use 0.0 oz/week     Comment: rarely-a beer a month  . Drug use: No  . Sexual activity: Yes    Partners: Female   Other Topics Concern  . Not on file   Social History Narrative  . No narrative on file     Current Outpatient Prescriptions:  .  aspirin 81 MG tablet, Take 1 tablet by mouth daily., Disp: , Rfl:  .  atorvastatin (LIPITOR) 40 MG tablet, Take 1 tablet (40 mg total) by mouth daily., Disp: 90 tablet, Rfl: 1 .  Cholecalciferol (VITAMIN D3) 2000 UNITS capsule, Take 1 capsule by mouth daily., Disp: , Rfl:  .  citalopram  (CELEXA) 40 MG tablet, Take 1 tablet (40 mg total) by mouth daily., Disp: 90 tablet, Rfl: 1 .  Dulaglutide (TRULICITY) 1.5 0000000 SOPN, Inject 1.5 mg into the skin once a week., Disp: 12 pen, Rfl: 0 .  ibuprofen (ADVIL,MOTRIN) 200 MG tablet, Take 2 tablets by mouth as needed., Disp: , Rfl:  .  metFORMIN (GLUCOPHAGE-XR) 750 MG 24 hr tablet, Take 1 tablet (750 mg total) by mouth daily with breakfast., Disp: 90 tablet, Rfl: 1 .  omeprazole (PRILOSEC) 20 MG capsule, Take 1 capsule (20 mg total) by mouth daily., Disp: 90 capsule, Rfl: 1  Allergies  Allergen Reactions  . Effexor Xr [Venlafaxine Hcl] Other (See Comments)    Unstable moods     ROS  Constitutional: Negative for fever or weight change.  Respiratory: Negative for cough and shortness of breath.   Cardiovascular: Negative for chest pain or palpitations.  Gastrointestinal: Negative for abdominal pain, no bowel changes.  Musculoskeletal: Negative for gait problem or joint swelling.  Skin: Negative for rash.  Neurological: Negative for dizziness or headache.  No other specific complaints in a complete review of systems (except as listed in HPI above).  Objective  Vitals:   12/13/16 0950  BP: 118/64  Pulse: 78  Resp: 18  Temp: 98 F (36.7 C)  SpO2: 98%  Weight: (!) 321 lb 5 oz (145.7 kg)  Height: 5\' 10"  (1.778 m)    Body mass index is 46.1 kg/m.  Physical Exam  Constitutional: Patient appears well-developed and well-nourished. Obese  No distress.  HEENT: head atraumatic, normocephalic, pupils equal and reactive to light, neck supple, throat within normal limits Cardiovascular: Normal rate, regular rhythm and normal heart sounds.  No murmur heard. No BLE edema. Pulmonary/Chest: Effort normal and breath sounds normal. No respiratory distress. Abdominal: Soft.  There is no tenderness. Psychiatric: Patient has a normal mood and affect. behavior is normal. Judgment and thought content normal.  Recent Results (from the  past 2160 hour(s))  POCT HgB A1C     Status: Abnormal   Collection Time: 12/13/16 10:03 AM  Result Value Ref Range   Hemoglobin A1C 6.1       PHQ2/9: Depression screen [facility] 2/9 12/13/2016 09/13/2016 12/29/2015 08/28/2015 04/24/2015  Decreased Interest 0 0 0 0 2  Down, Depressed, Hopeless 0 0 0 0 0  PHQ - 2 Score 0 0 0 0 2  Altered sleeping - - - - 0  Tired, decreased energy - - - - 2  Change in appetite - - - - 0  Feeling bad or failure about yourself  - - - - 3  Trouble concentrating - - - - 0  Moving slowly or fidgety/restless - - - -  0  Suicidal thoughts - - - - 0  PHQ-9 Score - - - - 7  Difficult doing work/chores - - - - Not difficult at all    Fall Risk: Fall Risk  12/13/2016 09/13/2016 12/29/2015 08/28/2015 04/24/2015  Falls in the past year? No No No No No    Functional Status Survey: Is the patient deaf or have difficulty hearing?: No Does the patient have difficulty seeing, even when wearing glasses/contacts?: No Does the patient have difficulty concentrating, remembering, or making decisions?: No Does the patient have difficulty walking or climbing stairs?: No Does the patient have difficulty dressing or bathing?: No Does the patient have difficulty doing errands alone such as visiting a doctor's office or shopping?: No    Assessment & Plan  1. Controlled type 2 diabetes with neuropathy (HCC)  - POCT HgB A1C at goal - 6.1%  - Dulaglutide (TRULICITY) 1.5 0000000 SOPN; Inject 1.5 mg into the skin once a week.  Dispense: 12 pen; Refill: 0 - Comprehensive metabolic panel - Insulin, 2 Hour - Vitamin B12  2. Dyslipidemia  Continue medications, denies side effects  3. Mild episode of recurrent major depressive disorder ([place])  Doing well on medication   4. Extreme obesity (HCC)  We will try changing from Metformin to Trulicity to see if it will help him lose weight, he needs to stop having sweet beverages and pack lunch  5. Obstructive apnea  Continue CPAP  machine - CBC with Differential/Platelet  6. Gastroesophageal reflux disease without esophagitis  He is trying to wean self off PPI  - omeprazole (PRILOSEC) 20 MG capsule; Take 1 capsule (20 mg total) by mouth daily.  Dispense: 90 capsule; Refill: 1   7. Left hand paresthesia  Doing better since he is not resting left elbow on the arm rest while driving    8. Vitamin D deficiency  - VITAMIN D 25 Hydroxy (Vit-D Deficiency, Fractures)

## 2016-12-16 LAB — CBC
Hematocrit: 38.3 % (ref 37.5–51.0)
Hemoglobin: 12.9 g/dL — ABNORMAL LOW (ref 13.0–17.7)
MCH: 26.7 pg (ref 26.6–33.0)
MCHC: 33.7 g/dL (ref 31.5–35.7)
MCV: 79 fL (ref 79–97)
Platelets: 294 10*3/uL (ref 150–379)
RBC: 4.84 x10E6/uL (ref 4.14–5.80)
RDW: 15.2 % (ref 12.3–15.4)
WBC: 6.6 10*3/uL (ref 3.4–10.8)

## 2016-12-16 LAB — COMPREHENSIVE METABOLIC PANEL
ALT: 33 IU/L (ref 0–44)
AST: 21 IU/L (ref 0–40)
Albumin/Globulin Ratio: 1.4 (ref 1.2–2.2)
Albumin: 4.2 g/dL (ref 3.5–5.5)
Alkaline Phosphatase: 77 IU/L (ref 39–117)
BUN/Creatinine Ratio: 16 (ref 9–20)
BUN: 16 mg/dL (ref 6–24)
Bilirubin Total: 0.6 mg/dL (ref 0.0–1.2)
CO2: 27 mmol/L (ref 18–29)
Calcium: 9.3 mg/dL (ref 8.7–10.2)
Chloride: 99 mmol/L (ref 96–106)
Creatinine, Ser: 0.98 mg/dL (ref 0.76–1.27)
GFR calc Af Amer: 106 mL/min/{1.73_m2} (ref >59)
GFR calc non Af Amer: 91 mL/min/{1.73_m2} (ref >59)
Globulin, Total: 2.9 g/dL (ref 1.5–4.5)
Glucose: 97 mg/dL (ref 65–99)
Potassium: 4.6 mmol/L (ref 3.5–5.2)
Sodium: 140 mmol/L (ref 134–144)
Total Protein: 7.1 g/dL (ref 6.0–8.5)

## 2016-12-16 LAB — LIPID PANEL
Chol/HDL Ratio: 4.3 ratio units (ref 0.0–5.0)
Cholesterol, Total: 116 mg/dL (ref 100–199)
HDL: 27 mg/dL — ABNORMAL LOW (ref >39)
LDL Calculated: 61 mg/dL (ref 0–99)
Triglycerides: 140 mg/dL (ref 0–149)
VLDL Cholesterol Cal: 28 mg/dL (ref 5–40)

## 2016-12-16 LAB — VITAMIN D 25 HYDROXY (VIT D DEFICIENCY, FRACTURES): Vit D, 25-Hydroxy: 33.3 ng/mL (ref 30.0–100.0)

## 2016-12-16 LAB — INSULIN, 2 HOUR: Insulin, 2 hour: 32.3 u[IU]/mL (ref 0.0–145.4)

## 2016-12-16 LAB — VITAMIN B12: Vitamin B-12: 399 pg/mL (ref 232–1245)

## 2016-12-17 ENCOUNTER — Other Ambulatory Visit: Payer: Self-pay | Admitting: Family Medicine

## 2016-12-17 DIAGNOSIS — D649 Anemia, unspecified: Secondary | ICD-10-CM | POA: Insufficient documentation

## 2017-01-30 ENCOUNTER — Other Ambulatory Visit: Payer: Self-pay

## 2017-01-30 DIAGNOSIS — E785 Hyperlipidemia, unspecified: Secondary | ICD-10-CM

## 2017-01-30 NOTE — Telephone Encounter (Signed)
Patient requesting refill of Atorvastatin to [facility]. 

## 2017-02-03 MED ORDER — ATORVASTATIN CALCIUM 40 MG PO TABS: 40.0000 mg | ORAL_TABLET | Freq: Every day | ORAL | 1 refills | Status: DC

## 2017-03-14 ENCOUNTER — Ambulatory Visit: Payer: 59 | Admitting: Family Medicine

## 2017-03-17 ENCOUNTER — Ambulatory Visit (INDEPENDENT_AMBULATORY_CARE_PROVIDER_SITE_OTHER): Payer: 59 | Admitting: Family Medicine

## 2017-03-17 ENCOUNTER — Encounter: Payer: Self-pay | Admitting: Family Medicine

## 2017-03-17 VITALS — BP 128/64 | HR 69 | Temp 97.9°F | Resp 16 | Ht 70.0 in | Wt 327.4 lb

## 2017-03-17 DIAGNOSIS — K219 Gastro-esophageal reflux disease without esophagitis: Secondary | ICD-10-CM

## 2017-03-17 DIAGNOSIS — E668 Other obesity: Secondary | ICD-10-CM

## 2017-03-17 DIAGNOSIS — G4733 Obstructive sleep apnea (adult) (pediatric): Secondary | ICD-10-CM

## 2017-03-17 DIAGNOSIS — E785 Hyperlipidemia, unspecified: Secondary | ICD-10-CM

## 2017-03-17 DIAGNOSIS — D649 Anemia, unspecified: Secondary | ICD-10-CM

## 2017-03-17 DIAGNOSIS — Z794 Long term (current) use of insulin: Secondary | ICD-10-CM

## 2017-03-17 DIAGNOSIS — E118 Type 2 diabetes mellitus with unspecified complications: Secondary | ICD-10-CM | POA: Diagnosis not present

## 2017-03-17 DIAGNOSIS — F330 Major depressive disorder, recurrent, mild: Secondary | ICD-10-CM | POA: Diagnosis not present

## 2017-03-17 DIAGNOSIS — M25571 Pain in right ankle and joints of right foot: Secondary | ICD-10-CM

## 2017-03-17 LAB — POCT GLYCOSYLATED HEMOGLOBIN (HGB A1C): Hemoglobin A1C: 5.7

## 2017-03-17 MED ORDER — OMEPRAZOLE 20 MG PO CPDR: 20.0000 mg | DELAYED_RELEASE_CAPSULE | Freq: Every day | ORAL | 1 refills | Status: DC

## 2017-03-17 MED ORDER — CITALOPRAM HYDROBROMIDE 40 MG PO TABS: 40.0000 mg | ORAL_TABLET | Freq: Every day | ORAL | 1 refills | Status: DC

## 2017-03-17 MED ORDER — DULAGLUTIDE 1.5 MG/0.5ML ~~LOC~~ SOAJ: 1.5000 mg | SUBCUTANEOUS | 1 refills | Status: DC

## 2017-03-17 NOTE — Progress Notes (Signed)
Name: Jimmy Bryant   MRN: [id number]    DOB: April 24, 1971   Date:03/17/2017       Progress Note  Subjective  Chief Complaint  Chief Complaint  Patient presents with  . Medication Refill    Would like a 90 day supply.  . Diabetes    Checks BS periodically runs around 117  . Obesity    Has gained 6 pounds since last visit  . Depression  . Hyperlipidemia    Denies any symptoms  . Gastroesophageal Reflux    HPI  DM with ED:He stopped drinking sodas for breakfast and honey buns, but he has been eating unhealthy snacks in his truck. He deniespolydipsia , polyuria or polyphagia. He is only on Trulicity and [id number] had  has gone up from 6.2 % to 6.7% and down to 6.1% and 5.7% today. Taking aspirin daily. Eye exam is up to date. Not on ED medication because of cost. He states Trulicity is not curbing his appetite.   Hyperlipidemia: he is taking Atorvastatin and denies muscle pain, no chest pain   Obesity: he was doing well with job switch and dietary modification, he has gained weight since last visit, he states he will try changing his diet again.   ED: he was taking Viagra but did not work and too expensive. He saw Urologist and was told to lose weight. Discussed Revation but he would like to hold off for now.  OSA: using CPAP machine every night, all night, he has one machine for the house and one for his truck. Feels more rested, he has also noticed that he can dream now. He denies headaches when he wakes up or feeling sleepy during the day.   Major Depression: he is on Celexa ( he states he got a refill of Effexor that was at the pharmacy and it made him angry and he is back on Celexa) He states Celexa works well for him, no longer snappy and angry, it keeps his mood stable, no crying spells, no suicidal thoughts or ideation, he denies anhedonia.   Left hand paresthesia: symptoms started about 4 months  usually happens while driving and he states he rests his left elbow on his  door and the left hand ( 4th and 5th fingers ) are going numb intermittently, he rubs the fingers and improves symptoms. He states he is trying not to rest his left elbow on hard surfaces and he does not want any other evaluation at this time  Ankle instability: he wears slip on shoes when he drives and tends to twist ankles on rocky surfaces. He will try to wear tennis shoes from now on. He states last he twisted his ankles was the right side about 3 weeks ago, it is not painful now but is still slightly swollen. He does not want to have a X-ray at this time.   GERD: he is doing well, no heartburn or indigestion   Patient Active Problem List   Diagnosis Date Noted  . Anemia, unspecified 12/17/2016  . Mild episode of recurrent major depressive disorder ([place]) 12/29/2015  . Diabetes mellitus type II, controlled ([place]) 04/30/2015  . Circadian rhythm sleep disorder, shift work type 04/23/2015  . Dyslipidemia 04/23/2015  . H/O lumbosacral spine surgery 04/23/2015  . H/O umbilical hernia repair 27/03/2375  . Dysmetabolic syndrome 28/31/5176  . Extreme obesity 04/23/2015  . ED (erectile dysfunction) of organic origin 04/23/2015  . Obstructive apnea 04/23/2015  . Vitamin D deficiency 04/23/2015  . Decreased  testosterone level 04/23/2015  . Gastro-esophageal reflux disease without esophagitis 08/22/2009    Past Surgical History:  Procedure Laterality Date  . microdecompression lumbar spine  02/2011   L4-5 and L5-S1 by Dr. Babette Relic  . UMBILICAL HERNIA REPAIR  07/10/2011    Family History  Problem Relation Age of Onset  . Diabetes Mother   . Hypertension Father   . CAD Father   . Hypertension Sister   . Hypertension Daughter     Social History   Social History  . Marital status: Married    Spouse name: N/A  . Number of children: N/A  . Years of education: N/A   Occupational History  . Not on file.   Social History Main Topics  . Smoking status: Former Smoker    Packs/day: 1.00     Years: 31.00    Types: Cigarettes    Start date: 10/28/1980    Quit date: 06/28/2012  . Smokeless tobacco: Never Used  . Alcohol use 0.0 oz/week     Comment: rarely-a beer a month  . Drug use: No  . Sexual activity: Yes    Partners: Female   Other Topics Concern  . Not on file   Social History Narrative  . No narrative on file     Current Outpatient Prescriptions:  .  aspirin 81 MG tablet, Take 1 tablet by mouth daily., Disp: , Rfl:  .  atorvastatin (LIPITOR) 40 MG tablet, Take 1 tablet (40 mg total) by mouth daily., Disp: 90 tablet, Rfl: 1 .  Cholecalciferol (VITAMIN D3) 2000 UNITS capsule, Take 1 capsule by mouth daily., Disp: , Rfl:  .  citalopram (CELEXA) 40 MG tablet, Take 1 tablet (40 mg total) by mouth daily., Disp: 90 tablet, Rfl: 1 .  Dulaglutide (TRULICITY) 1.5 ZO/1.0RU SOPN, Inject 1.5 mg into the skin once a week., Disp: 12 pen, Rfl: 1 .  ibuprofen (ADVIL,MOTRIN) 200 MG tablet, Take 2 tablets by mouth as needed., Disp: , Rfl:  .  omeprazole (PRILOSEC) 20 MG capsule, Take 1 capsule (20 mg total) by mouth daily., Disp: 90 capsule, Rfl: 1  Allergies  Allergen Reactions  . Effexor Xr [Venlafaxine Hcl] Other (See Comments)    Unstable moods     ROS  Constitutional: Negative for fever, positive for weight change.  Respiratory: Negative for cough and shortness of breath.   Cardiovascular: Negative for chest pain or palpitations.  Gastrointestinal: Negative for abdominal pain, no bowel changes.  Musculoskeletal: Negative for gait problem or joint swelling.  Skin: Negative for rash.  Neurological: Negative for dizziness or headache.  No other specific complaints in a complete review of systems (except as listed in HPI above).  Objective  Vitals:   03/17/17 0802  BP: 128/64  Pulse: 69  Resp: 16  Temp: 97.9 F (36.6 C)  TempSrc: Oral  SpO2: 96%  Weight: (!) 327 lb 6.4 oz (148.5 kg)  Height: 5\' 10"  (1.778 m)    Body mass index is 46.98 kg/m.  Physical  Exam  Constitutional: Patient appears well-developed and well-nourished. Morbid bese  No distress.  HEENT: head atraumatic, normocephalic, pupils equal and reactive to light,  neck supple, throat within normal limits Cardiovascular: Normal rate, regular rhythm and normal heart sounds.  No murmur heard. No BLE edema. Pulmonary/Chest: Effort normal and breath sounds normal. No respiratory distress. Abdominal: Soft.  There is no tenderness. Psychiatric: Patient has a normal mood and affect. behavior is normal. Judgment and thought content normal. Muscular Skeletal: he has some  edema on lateral aspect of right ankle, pain with dorsiflexion of foot, pain during palpation of right lateral malleolus but more tender on right lateral lower leg, no calf tenderness.   PHQ2/9: Depression screen [facility] 2/9 03/17/2017 12/13/2016 09/13/2016 12/29/2015 08/28/2015  Decreased Interest 0 0 0 0 0  Down, Depressed, Hopeless 0 0 0 0 0  PHQ - 2 Score 0 0 0 0 0  Altered sleeping - - - - -  Tired, decreased energy - - - - -  Change in appetite - - - - -  Feeling bad or failure about yourself  - - - - -  Trouble concentrating - - - - -  Moving slowly or fidgety/restless - - - - -  Suicidal thoughts - - - - -  PHQ-9 Score - - - - -  Difficult doing work/chores - - - - -     Fall Risk: Fall Risk  03/17/2017 12/13/2016 09/13/2016 12/29/2015 08/28/2015  Falls in the past year? No No No No No     Functional Status Survey: Is the patient deaf or have difficulty hearing?: No Does the patient have difficulty seeing, even when wearing glasses/contacts?: No Does the patient have difficulty concentrating, remembering, or making decisions?: No Does the patient have difficulty walking or climbing stairs?: No Does the patient have difficulty dressing or bathing?: No Does the patient have difficulty doing errands alone such as visiting a doctor's office or shopping?: No   Assessment & Plan  1. Controlled type 2 diabetes  mellitus with complication, with long-term current use of insulin (HCC)  - POCT HgB A1C - Dulaglutide (TRULICITY) 1.5 MO/2.9UT SOPN; Inject 1.5 mg into the skin once a week.  Dispense: 12 pen; Refill: 1  2. Dyslipidemia  Eating tree nuts and taking statins, no side effects  3. Mild episode of recurrent major depressive disorder (HCC)  - citalopram (CELEXA) 40 MG tablet; Take 1 tablet (40 mg total) by mouth daily.  Dispense: 90 tablet; Refill: 1  4. Extreme obesity  He will resume a healthier diet, works a Administrator, and has been snacking on junk food  5. Obstructive apnea  He wears his CPAP machine even on the road  6. Anemia, unspecified type  - CBC With Differential - Ferritin, Serum (Serial) - Iron Binding Cap (TIBC)  7. Gastroesophageal reflux disease without esophagitis  - omeprazole (PRILOSEC) 20 MG capsule; Take 1 capsule (20 mg total) by mouth daily.  Dispense: 90 capsule; Refill: 1  8. Right ankle pain, unspecified chronicity  Advised PT but he can't afford it, I will give him home exercises Also discussed x-ray and referral to Podiatrist - but he refuses at this time He will try Biofreeze

## 2017-03-17 NOTE — Patient Instructions (Signed)
Ankle Exercises Ask your health care provider which exercises are safe for you. Do exercises exactly as told by your health care provider and adjust them as directed. It is normal to feel mild stretching, pulling, tightness, or discomfort as you do these exercises, but you should stop right away if you feel sudden pain or your pain gets worse. Do not begin these exercises until told by your health care provider. Stretching and range of motion exercises These exercises warm up your muscles and joints and improve the movement and flexibility of your ankle. These exercises also help to relieve pain, numbness, and tingling. Exercise A: Dorsiflexion/Plantar Flexion   1. Sit with your __________ knee straight or bent. Do not rest your foot on anything. 2. Flex your __________ ankle to tilt the top of your foot toward your shin. 3. Hold this position for __________ seconds. 4. Point your toes downward to tilt the top of your foot away from your shin. 5. Hold this position for __________ seconds. Repeat __________ times. Complete this exercise __________ times a day. Exercise B: Ankle Alphabet   1. Sit with your __________ foot supported at your lower leg.  Do not rest your foot on anything.  Make sure your foot has room to move freely. 2. Think of your __________ foot as a paintbrush, and move your foot to trace each letter of the alphabet in the air. Keep your hip and knee still while you trace. Make the letters as large as you can without increasing any discomfort. 3. Trace every letter from A to Z. Repeat __________ times. Complete this exercise __________ times a day. Exercise C: Ankle Dorsiflexion, Passive  1. Sit on a chair that is placed on a non-carpeted surface. 2. Place your __________ foot on the floor, directly under your __________ knee. Extend your __________ leg for support. 3. Keeping your heel down, slide your __________ foot back toward the chair until you feel a stretch at your  ankle or calf. If you do not feel a stretch, slide your buttocks forward to the edge of the chair. 4. Hold this stretch for __________ seconds. Repeat __________ times. Complete this stretch __________ times a day. Strengthening exercises These exercises build strength and endurance in your ankle. Endurance is the ability to use your muscles for a long time, even after they get tired. Exercise D: Dorsiflexors   1. Secure a rubber exercise band or tube to an object, such as a table leg, that will stay still when the band is pulled. Secure the other end around your __________ foot. 2. Sit on the floor, facing the object with your __________ leg extended. The band or tube should be slightly tense when your foot is relaxed. 3. Slowly flex your __________ ankle and toes to bring your foot toward you. 4. Hold this position for __________ seconds. 5. Slowly return your foot to the starting position, controlling the band as you do that. Repeat __________ times. Complete this exercise __________ times a day. Exercise E: Plantar Flexors   1. Sit on the floor with your __________ leg extended. 2. Loop a rubber exercise band or tube around the ball of your __________ foot. The ball of your foot is on the walking surface, right under your toes. The band or tube should be slightly tense when your foot is relaxed. 3. Slowly point your toes downward, pushing them away from you. 4. Hold this position for __________ seconds. 5. Slowly release the tension in the band or tube, controlling  smoothly until your foot is back in the starting position. Repeat __________ times. Complete this exercise __________ times a day. Exercise F: Towel Curls   1. Sit in a chair on a non-carpeted surface, and put your feet on the floor. 2. Place a towel in front of your feet. If told by your health care provider, add __________ to the end of the towel. 3. Keeping your heel on the floor, put your __________ foot on the  towel. 4. Pull the towel toward you by grabbing the towel with your toes and curling them under. Keep your heel on the floor. 5. Let your toes relax. 6. Grab the towel again. Keep going until the towel is completely underneath your foot. Repeat __________ times. Complete this exercise __________ times a day. Exercise G: Heel Raise (  Plantar Flexors, Standing)  1. Stand with your feet shoulder-width apart. 2. Keep your weight spread evenly over the width of your feet while you rise up on your toes. Use a wall or table to steady yourself, but try not to use it for support. 3. If this exercise is too easy, try these options:  Shift your weight toward your __________ leg until you feel challenged.  If told by your health care provider, lift your uninjured leg off the floor. 4. Hold this position for __________ seconds. Repeat __________ times. Complete this exercise __________ times a day. Exercise H: Tandem Walking  1. Stand with one foot directly in front of the other. 2. Slowly raise your back foot up, lifting your heel before your toes, and place it directly in front of your other foot. 3. Continue to walk in this heel-to-toe way for __________ or for as long as told by your health care provider. Have a countertop or wall nearby to use if needed to keep your balance, but try not to hold onto anything for support. Repeat __________ times. Complete this exercises __________ times a day. This information is not intended to replace advice given to you by your health care provider. Make sure you discuss any questions you have with your health care provider. Document Released: 08/28/2005 Document Revised: 06/13/2016 Document Reviewed: 07/02/2015 Elsevier Interactive Patient Education  2017 [employer].

## 2017-03-18 LAB — CBC WITH DIFFERENTIAL
Basophils Absolute: 0 x10E3/uL (ref 0.0–0.2)
Basos: 0 %
EOS (ABSOLUTE): 0.2 x10E3/uL (ref 0.0–0.4)
Eos: 2 %
Hematocrit: 38.6 % (ref 37.5–51.0)
Hemoglobin: 12.6 g/dL — ABNORMAL LOW (ref 13.0–17.7)
Immature Grans (Abs): 0 x10E3/uL (ref 0.0–0.1)
Immature Granulocytes: 0 %
Lymphocytes Absolute: 2.2 x10E3/uL (ref 0.7–3.1)
Lymphs: 31 %
MCH: 25.9 pg — ABNORMAL LOW (ref 26.6–33.0)
MCHC: 32.6 g/dL (ref 31.5–35.7)
MCV: 79 fL (ref 79–97)
Monocytes Absolute: 0.6 x10E3/uL (ref 0.1–0.9)
Monocytes: 8 %
Neutrophils Absolute: 4.2 x10E3/uL (ref 1.4–7.0)
Neutrophils: 59 %
RBC: 4.86 x10E6/uL (ref 4.14–5.80)
RDW: 15.1 % (ref 12.3–15.4)
WBC: 7.2 x10E3/uL (ref 3.4–10.8)

## 2017-03-18 LAB — IRON AND TIBC
Iron Saturation: 18 % (ref 15–55)
Iron: 48 ug/dL (ref 38–169)
Total Iron Binding Capacity: 273 ug/dL (ref 250–450)
UIBC: 225 ug/dL (ref 111–343)

## 2017-03-28 ENCOUNTER — Other Ambulatory Visit: Payer: Self-pay

## 2017-03-28 DIAGNOSIS — D649 Anemia, unspecified: Secondary | ICD-10-CM

## 2017-03-28 LAB — POC HEMOCCULT BLD/STL (HOME/3-CARD/SCREEN)
Card #2 Fecal Occult Blod, POC: NEGATIVE
Card #3 Fecal Occult Blood, POC: NEGATIVE
Fecal Occult Blood, POC: NEGATIVE

## 2017-03-29 NOTE — Progress Notes (Signed)
Home hemoccult is negative for blood. Please notify patient. Thank you!

## 2017-06-20 ENCOUNTER — Ambulatory Visit (INDEPENDENT_AMBULATORY_CARE_PROVIDER_SITE_OTHER): Payer: 59 | Admitting: Family Medicine

## 2017-06-20 ENCOUNTER — Encounter: Payer: Self-pay | Admitting: Family Medicine

## 2017-06-20 VITALS — BP 122/68 | HR 67 | Temp 98.6°F | Resp 16 | Ht 70.0 in | Wt 335.6 lb

## 2017-06-20 DIAGNOSIS — E559 Vitamin D deficiency, unspecified: Secondary | ICD-10-CM

## 2017-06-20 DIAGNOSIS — E1140 Type 2 diabetes mellitus with diabetic neuropathy, unspecified: Secondary | ICD-10-CM | POA: Diagnosis not present

## 2017-06-20 DIAGNOSIS — Z79899 Other long term (current) drug therapy: Secondary | ICD-10-CM

## 2017-06-20 DIAGNOSIS — F330 Major depressive disorder, recurrent, mild: Secondary | ICD-10-CM | POA: Diagnosis not present

## 2017-06-20 DIAGNOSIS — Z23 Encounter for immunization: Secondary | ICD-10-CM | POA: Diagnosis not present

## 2017-06-20 DIAGNOSIS — E785 Hyperlipidemia, unspecified: Secondary | ICD-10-CM

## 2017-06-20 DIAGNOSIS — Z0000 Encounter for general adult medical examination without abnormal findings: Secondary | ICD-10-CM

## 2017-06-20 DIAGNOSIS — Z794 Long term (current) use of insulin: Secondary | ICD-10-CM | POA: Diagnosis not present

## 2017-06-20 DIAGNOSIS — G4733 Obstructive sleep apnea (adult) (pediatric): Secondary | ICD-10-CM | POA: Diagnosis not present

## 2017-06-20 DIAGNOSIS — E118 Type 2 diabetes mellitus with unspecified complications: Secondary | ICD-10-CM | POA: Diagnosis not present

## 2017-06-20 MED ORDER — DULOXETINE HCL 30 MG PO CPEP: 30.0000 mg | ORAL_CAPSULE | Freq: Every day | ORAL | 0 refills | Status: DC

## 2017-06-20 MED ORDER — ATORVASTATIN CALCIUM 40 MG PO TABS: 40.0000 mg | ORAL_TABLET | Freq: Every day | ORAL | 1 refills | Status: DC

## 2017-06-20 MED ORDER — METFORMIN HCL ER 750 MG PO TB24: 750.0000 mg | ORAL_TABLET | Freq: Every day | ORAL | 1 refills | Status: DC

## 2017-06-20 NOTE — Progress Notes (Signed)
Name: Jimmy Bryant   MRN: [id number]    DOB: 1971-06-17   Date:06/20/2017       Progress Note  Subjective  Chief Complaint  Chief Complaint  Patient presents with  . Annual Exam    pt stated that he stopped taking trulicity a month ago due to nausea and diarrhea    HPI   Male Exam: he has low libido, he has ED, he states he is self conscious about the size of his penis, he worries about it. No change in bowel movements.   DM with ED:He stopped drinking sodas for breakfast - sometimes has a diet Pepsi, still having sweet tea - but he will try to try  and honey buns, but he has been eating unhealthy snacks in his truck. He deniespolydipsia , polyuria or polyphagia. He was on Trucility because of nausea and diarrhea, going to have bariatric surgery, in the mean time he would like to go back on Metformin.  Taking aspirin daily. Eye exam is up to date. Not on ED medication because of cost. Glucose at home has been 107-151.   Hyperlipidemia: he is taking Atorvastatin and denies muscle pain, no chest pain , we will recheck labs  Obesity: gaining weight again, he is going to have surgery, states his wife is getting him set up. He states he will try to wean self off sweet tea  OSA: using CPAP machine every night, all night, he has one machine for the house and one for his truck. Feels more rested, he has also noticed that he can dream now. He denies headaches when he wakes up but is waking up feeling tired, but doing well throughout the day  Major Depression: he is on Celexa ( he states he got a refill of Effexor that was at the pharmacy and it made him angry and he is back on Celexa) He states Celexa works well for him but over the past 3 weeks he has been getting snappy and angry again, no crying spells, but his mind is busy at night and takes 30 minutes to fall asleep at night  IPSS Questionnaire (AUA-7): Over the past month.   1)  How often have you had a sensation of not emptying  your bladder completely after you finish urinating?  0 - Not at all  2)  How often have you had to urinate again less than two hours after you finished urinating? 0 - Not at all  3)  How often have you found you stopped and started again several times when you urinated?  0 - Not at all  4) How difficult have you found it to postpone urination?  0 - Not at all  5) How often have you had a weak urinary stream?  0 - Not at all  6) How often have you had to push or strain to begin urination?  0 - Not at all  7) How many times did you most typically get up to urinate from the time you went to bed until the time you got up in the morning?  0 - None  Total score:  0-7 mildly symptomatic   8-19 moderately symptomatic   20-35 severely symptomatic      Patient Active Problem List   Diagnosis Date Noted  . Anemia, unspecified 12/17/2016  . Mild episode of recurrent major depressive disorder ([place]) 12/29/2015  . Diabetes mellitus type II, controlled ([place]) 04/30/2015  . Circadian rhythm sleep disorder, shift work type 04/23/2015  .  Dyslipidemia 04/23/2015  . H/O lumbosacral spine surgery 04/23/2015  . H/O umbilical hernia repair 08/67/6195  . Dysmetabolic syndrome 09/32/6712  . Extreme obesity 04/23/2015  . ED (erectile dysfunction) of organic origin 04/23/2015  . Obstructive apnea 04/23/2015  . Vitamin D deficiency 04/23/2015  . Decreased testosterone level 04/23/2015  . Gastro-esophageal reflux disease without esophagitis 08/22/2009    Past Surgical History:  Procedure Laterality Date  . microdecompression lumbar spine  02/2011   L4-5 and L5-S1 by Dr. Babette Relic  . UMBILICAL HERNIA REPAIR  07/10/2011    Family History  Problem Relation Age of Onset  . Diabetes Mother   . Hypertension Father   . CAD Father   . Hypertension Sister   . Hypertension Daughter   . ADD / ADHD Son     Social History   Social History  . Marital status: Married    Spouse name: N/A  . Number of children:  N/A  . Years of education: N/A   Occupational History  . Not on file.   Social History Main Topics  . Smoking status: Former Smoker    Packs/day: 1.00    Years: 31.00    Types: Cigarettes    Start date: 10/28/1980    Quit date: 06/28/2012  . Smokeless tobacco: Never Used  . Alcohol use 0.0 oz/week     Comment: rarely-a beer a month  . Drug use: No  . Sexual activity: Yes    Partners: Female   Other Topics Concern  . Not on file   Social History Narrative  . No narrative on file     Current Outpatient Prescriptions:  .  aspirin 81 MG tablet, Take 1 tablet by mouth daily., Disp: , Rfl:  .  atorvastatin (LIPITOR) 40 MG tablet, Take 1 tablet (40 mg total) by mouth daily., Disp: 90 tablet, Rfl: 1 .  Cholecalciferol (VITAMIN D3) 2000 UNITS capsule, Take 1 capsule by mouth daily., Disp: , Rfl:  .  ibuprofen (ADVIL,MOTRIN) 200 MG tablet, Take 2 tablets by mouth as needed., Disp: , Rfl:  .  omeprazole (PRILOSEC) 20 MG capsule, Take 1 capsule (20 mg total) by mouth daily., Disp: 90 capsule, Rfl: 1 .  DULoxetine (CYMBALTA) 30 MG capsule, Take 1-2 capsules (30-60 mg total) by mouth daily. Start with one daily and after first go to 60 mg, Disp: 60 capsule, Rfl: 0 .  metFORMIN (GLUCOPHAGE XR) 750 MG 24 hr tablet, Take 1 tablet (750 mg total) by mouth daily with breakfast., Disp: 180 tablet, Rfl: 1  Allergies  Allergen Reactions  . Effexor Xr [Venlafaxine Hcl] Other (See Comments)    Unstable moods  . Trulicity [Dulaglutide] Nausea Only    And diarrhea     ROS  Constitutional: Negative for fever , positive for weight change.  Respiratory: Negative for cough and shortness of breath.   Cardiovascular: Negative for chest pain or palpitations.  Gastrointestinal: Negative for abdominal pain, no bowel changes.  Musculoskeletal: Negative for gait problem or joint swelling.  Skin: Negative for rash.  Neurological: Negative for dizziness or headache.   No other specific complaints in a  complete review of systems (except as listed in HPI above).  Objective  Vitals:   06/20/17 0829  BP: 122/68  Pulse: 67  Resp: 16  Temp: 98.6 F (37 C)  SpO2: 97%  Weight: (!) 335 lb 9 oz (152.2 kg)  Height: 5\' 10"  (1.778 m)    Body mass index is 48.15 kg/m.  Physical Exam  Constitutional:  Patient appears well-developed and obese. No distress.  HENT: Head: Normocephalic and atraumatic. Ears: B TMs ok, no erythema or effusion; Nose: Nose normal. Mouth/Throat: Oropharynx is clear and moist. No oropharyngeal exudate.  Eyes: Conjunctivae and EOM are normal. Pupils are equal, round, and reactive to light. No scleral icterus.  Neck: Normal range of motion. Neck supple. No JVD present. No thyromegaly present.  Cardiovascular: Normal rate, regular rhythm and normal heart sounds.  No murmur heard. Trace  BLE edema. Pulmonary/Chest: Effort normal and breath sounds normal. No respiratory distress. Abdominal: Soft. Bowel sounds are normal, no distension. There is no tenderness. no masses MALE GENITALIA: Normal descended testes bilaterally, no masses palpated, no hernias, no lesions, no discharge, micro penis RECTAL: not done Musculoskeletal: Normal range of motion, no joint effusions. No gross deformities Neurological: he is alert and oriented to person, place, and time. No cranial nerve deficit. Coordination, balance, strength, speech and gait are normal.  Skin: Skin is warm and dry. No rash noted. No erythema.  Psychiatric: Patient has a normal mood and affect. behavior is normal. Judgment and thought content normal.  Recent Results (from the past 2160 hour(s))  POC Hemoccult Bld/Stl (3-Cd Home Screen)     Status: Normal   Collection Time: 03/28/17  4:10 PM  Result Value Ref Range   Card #1 Date 03/22/2017    Fecal Occult Blood, POC Negative Negative   Card #2 Date 03/23/2017    Card #2 Fecal Occult Blod, POC Negative    Card #3 Date 03/24/2017    Card #3 Fecal Occult Blood, POC  Negative     Diabetic Foot Exam: Diabetic Foot Exam - Simple   Simple Foot Form Diabetic Foot exam was performed with the following findings:  Yes 06/20/2017  9:10 AM  Visual Inspection No deformities, no ulcerations, no other skin breakdown bilaterally:  Yes Sensation Testing Intact to touch and monofilament testing bilaterally:  Yes Pulse Check Posterior Tibialis and Dorsalis pulse intact bilaterally:  Yes Comments      PHQ2/9: Depression screen [facility] 2/9 06/20/2017 06/20/2017 03/17/2017 12/13/2016 09/13/2016  Decreased Interest 0 1 0 0 0  Down, Depressed, Hopeless 1 1 0 0 0  PHQ - 2 Score 1 2 0 0 0  Altered sleeping 3 1 - - -  Tired, decreased energy 2 0 - - -  Change in appetite 0 0 - - -  Feeling bad or failure about yourself  0 0 - - -  Trouble concentrating 0 0 - - -  Moving slowly or fidgety/restless 0 0 - - -  Suicidal thoughts 0 0 - - -  PHQ-9 Score 6 3 - - -  Difficult doing work/chores Not difficult at all Not difficult at all - - -     Fall Risk: Fall Risk  06/20/2017 03/17/2017 12/13/2016 09/13/2016 12/29/2015  Falls in the past year? Yes No No No No  Number falls in past yr: 2 or more - - - -  Injury with Fall? No - - - -  Follow up Falls evaluation completed - - - -     Functional Status Survey: Is the patient deaf or have difficulty hearing?: No Does the patient have difficulty seeing, even when wearing glasses/contacts?: No Does the patient have difficulty concentrating, remembering, or making decisions?: No Does the patient have difficulty walking or climbing stairs?: No Does the patient have difficulty dressing or bathing?: No Does the patient have difficulty doing errands alone such as visiting a doctor's office  or shopping?: No  Current Exercise Habits: The patient does not participate in regular exercise at present   GAD 7 : Generalized Anxiety Score 06/20/2017  Nervous, Anxious, on Edge 2  Control/stop worrying 3  Worry too much - different things 3   Trouble relaxing 0  Restless 0  Easily annoyed or irritable 3  Afraid - awful might happen 0  Total GAD 7 Score 11  Anxiety Difficulty Not difficult at all     Assessment & Plan  1. Encounter for annual physical exam  Discussed with the patient the risk posed by an increased BMI. Discussed importance of portion control, calorie counting and at least 150 minutes of physical activity weekly. Avoid sweet beverages and drink more water. Eat at least 6 servings of fruit and vegetables daily  - CBC With Differential - Comp. Metabolic Panel (12)  2. Obstructive apnea  He has been wearing CPAP every night  3. Vitamin D deficiency  - VITAMIN D 25 Hydroxy (Vit-D Deficiency, Fractures)  4. Mild episode of recurrent major depressive disorder (HCC)  - DULoxetine (CYMBALTA) 30 MG capsule; Take 1-2 capsules (30-60 mg total) by mouth daily. Start with one daily and after first go to 60 mg  Dispense: 60 capsule; Refill: 0  5. Controlled type 2 diabetes with neuropathy (HCC)  - Hemoglobin A1c - metFORMIN (GLUCOPHAGE XR) 750 MG 24 hr tablet; Take 1 tablet (750 mg total) by mouth daily with breakfast.  Dispense: 180 tablet; Refill: 1  6. Controlled type 2 diabetes mellitus with complication, with long-term current use of insulin (HCC)  - Hemoglobin A1c - metFORMIN (GLUCOPHAGE XR) 750 MG 24 hr tablet; Take 1 tablet (750 mg total) by mouth daily with breakfast.  Dispense: 180 tablet; Refill: 1  7. Dyslipidemia  - Lipid panel - atorvastatin (LIPITOR) 40 MG tablet; Take 1 tablet (40 mg total) by mouth daily.  Dispense: 90 tablet; Refill: 1  8. Long-term use of high-risk medication  - Vitamin B12  9. Needs flu shot  - Flu Vaccine QUAD 36+ mos IM

## 2017-06-20 NOTE — Patient Instructions (Signed)
Take half of Citalopram and one capsule of Cymbalta for the first week, after that stop Citalopram and take 2 capsules of Cymbalta ( Duloxetine), call me in 4 weeks and I will send a refill of Duloxetine 60 mg to take once daily if you improve  Preventive Care 40-64 Years, Male Preventive care refers to lifestyle choices and visits with your health care provider that can promote health and wellness. What does preventive care include?  A yearly physical exam. This is also called an annual well check.  Dental exams once or twice a year.  Routine eye exams. Ask your health care provider how often you should have your eyes checked.  Personal lifestyle choices, including: ? Daily care of your teeth and gums. ? Regular physical activity. ? Eating a healthy diet. ? Avoiding tobacco and drug use. ? Limiting alcohol use. ? Practicing safe sex. ? Taking low-dose aspirin every day starting at age 59. What happens during an annual well check? The services and screenings done by your health care provider during your annual well check will depend on your age, overall health, lifestyle risk factors, and family history of disease. Counseling Your health care provider may ask you questions about your:  Alcohol use.  Tobacco use.  Drug use.  Emotional well-being.  Home and relationship well-being.  Sexual activity.  Eating habits.  Work and work Statistician.  Screening You may have the following tests or measurements:  Height, weight, and BMI.  Blood pressure.  Lipid and cholesterol levels. These may be checked every 5 years, or more frequently if you are over 37 years old.  Skin check.  Lung cancer screening. You may have this screening every year starting at age 34 if you have a 30-pack-year history of smoking and currently smoke or have quit within the past 15 years.  Fecal occult blood test (FOBT) of the stool. You may have this test every year starting at age  50.  Flexible sigmoidoscopy or colonoscopy. You may have a sigmoidoscopy every 5 years or a colonoscopy every 10 years starting at age 42.  Prostate cancer screening. Recommendations will vary depending on your family history and other risks.  Hepatitis C blood test.  Hepatitis B blood test.  Sexually transmitted disease (STD) testing.  Diabetes screening. This is done by checking your blood sugar (glucose) after you have not eaten for a while (fasting). You may have this done every 1-3 years.  Discuss your test results, treatment options, and if necessary, the need for more tests with your health care provider. Vaccines Your health care provider may recommend certain vaccines, such as:  Influenza vaccine. This is recommended every year.  Tetanus, diphtheria, and acellular pertussis (Tdap, Td) vaccine. You may need a Td booster every 10 years.  Varicella vaccine. You may need this if you have not been vaccinated.  Zoster vaccine. You may need this after age 52.  Measles, mumps, and rubella (MMR) vaccine. You may need at least one dose of MMR if you were born in 1957 or later. You may also need a second dose.  Pneumococcal 13-valent conjugate (PCV13) vaccine. You may need this if you have certain conditions and have not been vaccinated.  Pneumococcal polysaccharide (PPSV23) vaccine. You may need one or two doses if you smoke cigarettes or if you have certain conditions.  Meningococcal vaccine. You may need this if you have certain conditions.  Hepatitis A vaccine. You may need this if you have certain conditions or if you travel  or work in places where you may be exposed to hepatitis A.  Hepatitis B vaccine. You may need this if you have certain conditions or if you travel or work in places where you may be exposed to hepatitis B.  Haemophilus influenzae type b (Hib) vaccine. You may need this if you have certain risk factors.  Talk to your health care provider about which  screenings and vaccines you need and how often you need them. This information is not intended to replace advice given to you by your health care provider. Make sure you discuss any questions you have with your health care provider. Document Released: 11/10/2015 Document Revised: 07/03/2016 Document Reviewed: 08/15/2015 Elsevier Interactive Patient Education  2017 [employer].

## 2017-06-21 LAB — COMP. METABOLIC PANEL (12)
AST: 34 IU/L (ref 0–40)
Albumin/Globulin Ratio: 1.4 (ref 1.2–2.2)
Albumin: 4.2 g/dL (ref 3.5–5.5)
Alkaline Phosphatase: 86 IU/L (ref 39–117)
BUN/Creatinine Ratio: 12 (ref 9–20)
BUN: 12 mg/dL (ref 6–24)
Bilirubin Total: 0.6 mg/dL (ref 0.0–1.2)
Calcium: 9.1 mg/dL (ref 8.7–10.2)
Chloride: 99 mmol/L (ref 96–106)
Creatinine, Ser: 0.97 mg/dL (ref 0.76–1.27)
GFR calc Af Amer: 106 mL/min/{1.73_m2} (ref >59)
GFR calc non Af Amer: 92 mL/min/{1.73_m2} (ref >59)
Globulin, Total: 3 g/dL (ref 1.5–4.5)
Glucose: 104 mg/dL — ABNORMAL HIGH (ref 65–99)
Potassium: 5.1 mmol/L (ref 3.5–5.2)
Sodium: 140 mmol/L (ref 134–144)
Total Protein: 7.2 g/dL (ref 6.0–8.5)

## 2017-06-21 LAB — HEMOGLOBIN A1C
Est. average glucose Bld gHb Est-mCnc: 131 mg/dL
Hgb A1c MFr Bld: 6.2 % — ABNORMAL HIGH (ref 4.8–5.6)

## 2017-06-21 LAB — CBC WITH DIFFERENTIAL
Basophils Absolute: 0.1 10*3/uL (ref 0.0–0.2)
Basos: 1 %
EOS (ABSOLUTE): 0.3 10*3/uL (ref 0.0–0.4)
Eos: 4 %
Hematocrit: 38.8 % (ref 37.5–51.0)
Hemoglobin: 12.6 g/dL — ABNORMAL LOW (ref 13.0–17.7)
Immature Grans (Abs): 0 10*3/uL (ref 0.0–0.1)
Immature Granulocytes: 0 %
Lymphocytes Absolute: 2.3 10*3/uL (ref 0.7–3.1)
Lymphs: 33 %
MCH: 26.1 pg — ABNORMAL LOW (ref 26.6–33.0)
MCHC: 32.5 g/dL (ref 31.5–35.7)
MCV: 80 fL (ref 79–97)
Monocytes Absolute: 0.6 10*3/uL (ref 0.1–0.9)
Monocytes: 9 %
Neutrophils Absolute: 3.9 10*3/uL (ref 1.4–7.0)
Neutrophils: 53 %
RBC: 4.83 x10E6/uL (ref 4.14–5.80)
RDW: 15 % (ref 12.3–15.4)
WBC: 7.2 10*3/uL (ref 3.4–10.8)

## 2017-06-21 LAB — LIPID PANEL
Chol/HDL Ratio: 4.3 ratio (ref 0.0–5.0)
Cholesterol, Total: 133 mg/dL (ref 100–199)
HDL: 31 mg/dL — ABNORMAL LOW (ref >39)
LDL Calculated: 76 mg/dL (ref 0–99)
Triglycerides: 131 mg/dL (ref 0–149)
VLDL Cholesterol Cal: 26 mg/dL (ref 5–40)

## 2017-06-21 LAB — VITAMIN D 25 HYDROXY (VIT D DEFICIENCY, FRACTURES): Vit D, 25-Hydroxy: 36.7 ng/mL (ref 30.0–100.0)

## 2017-06-21 LAB — VITAMIN B12: Vitamin B-12: 411 pg/mL (ref 232–1245)

## 2017-06-24 ENCOUNTER — Telehealth: Payer: Self-pay

## 2017-06-24 NOTE — Telephone Encounter (Signed)
Called and left patient a message to call back regarding labs.

## 2017-06-24 NOTE — Telephone Encounter (Signed)
-----   Message from Steele Sizer, MD sent at 06/21/2017 11:18 AM EDT ----- He still has mild anemia, and we need to add iron studies or refer him to hematologist now.  Glucose is at goal for her, normal kidney and liver function test Lipid panel shows low HDL : to improve HDL patient  needs to eat tree nuts ( pecans/pistachios/almonds ) four times weekly, eat fish two times weekly  and exercise  at least 150 minutes per week hgbA1C has gone up, needs to try to follow diabetic diet and exercise more Normal vitamin D  Normal B12 level

## 2017-07-10 ENCOUNTER — Telehealth: Payer: Self-pay | Admitting: Family Medicine

## 2017-07-10 NOTE — Telephone Encounter (Signed)
Pt was recently put on Cymbalta and states his symptoms are worse and this medication costed him $100.00 after insurance paid. Pt would like to go back on the the very first medication he was on. Pt only remembers it starts with a V. He remembers how well it worked and only changed it because of insurance but now they have new insurance. Pt would like to know if he should continue this medication since his symptoms are worse. Please advise pt. Pts wife states to call her @ [phone] because she is off today and will be able to answer the phone.

## 2017-07-11 ENCOUNTER — Other Ambulatory Visit: Payer: Self-pay | Admitting: Family Medicine

## 2017-07-11 MED ORDER — VENLAFAXINE HCL 37.5 MG PO TABS: 37.5000 mg | ORAL_TABLET | Freq: Two times a day (BID) | ORAL | 0 refills | Status: DC

## 2017-07-11 NOTE — Telephone Encounter (Signed)
Changed to Effexor and left message on his machine

## 2017-08-01 ENCOUNTER — Telehealth: Payer: Self-pay | Admitting: Family Medicine

## 2017-08-01 NOTE — Telephone Encounter (Signed)
Pt dropped off request to have note from dr about his a1c last done and results and also needs a note from pcp dr stating the patients medication management plan. Placing in Dr Ancil Boozer folder to give to nurses.

## 2017-08-17 ENCOUNTER — Other Ambulatory Visit: Payer: Self-pay | Admitting: Family Medicine

## 2017-09-12 ENCOUNTER — Other Ambulatory Visit: Payer: Self-pay | Admitting: Family Medicine

## 2017-09-12 ENCOUNTER — Encounter: Payer: Self-pay | Admitting: Family Medicine

## 2017-09-12 ENCOUNTER — Ambulatory Visit (INDEPENDENT_AMBULATORY_CARE_PROVIDER_SITE_OTHER): Payer: 59 | Admitting: Family Medicine

## 2017-09-12 VITALS — BP 156/94 | HR 93 | Resp 14 | Ht 70.0 in | Wt 317.0 lb

## 2017-09-12 DIAGNOSIS — D649 Anemia, unspecified: Secondary | ICD-10-CM | POA: Diagnosis not present

## 2017-09-12 DIAGNOSIS — E559 Vitamin D deficiency, unspecified: Secondary | ICD-10-CM | POA: Diagnosis not present

## 2017-09-12 DIAGNOSIS — E668 Other obesity: Secondary | ICD-10-CM

## 2017-09-12 DIAGNOSIS — F330 Major depressive disorder, recurrent, mild: Secondary | ICD-10-CM

## 2017-09-12 DIAGNOSIS — E1140 Type 2 diabetes mellitus with diabetic neuropathy, unspecified: Secondary | ICD-10-CM | POA: Diagnosis not present

## 2017-09-12 DIAGNOSIS — E785 Hyperlipidemia, unspecified: Secondary | ICD-10-CM

## 2017-09-12 DIAGNOSIS — I10 Essential (primary) hypertension: Secondary | ICD-10-CM

## 2017-09-12 DIAGNOSIS — G4733 Obstructive sleep apnea (adult) (pediatric): Secondary | ICD-10-CM

## 2017-09-12 LAB — POCT GLYCOSYLATED HEMOGLOBIN (HGB A1C): Hemoglobin A1C: 6.8

## 2017-09-12 MED ORDER — VENLAFAXINE HCL ER 75 MG PO CP24: 75.0000 mg | ORAL_CAPSULE | Freq: Every day | ORAL | 0 refills | Status: DC

## 2017-09-12 MED ORDER — HYDROCHLOROTHIAZIDE 12.5 MG PO TABS: 12.5000 mg | ORAL_TABLET | Freq: Every day | ORAL | 0 refills | Status: DC

## 2017-09-12 NOTE — Addendum Note (Signed)
Addended by: Inda Coke on: 09/12/2017 08:58 AM   Modules accepted: Orders

## 2017-09-12 NOTE — Progress Notes (Signed)
Name: Jimmy Bryant   MRN: [id number]    DOB: 12/22/1970   Date:09/12/2017       Progress Note  Subjective  Chief Complaint  Chief Complaint  Patient presents with  . Depression  . Diabetes    HPI  DM with ED:He stopped drinking sodas for breakfast - sometimes has a diet Pepsi, he is off sweet tea. He is also not snacking on honey buns, taking protein bars in his truck.. He deniespolydipsia , polyuria or polyphagia. He was on Trucility because of nausea and diarrhea, going to have bariatric surgery, he is back on Metformin at this time. HgbA1C has gone up a little fro 6.2 to 6.8% .   Taking aspirin daily. Eye exam is up to date. Not on ED medication because of cost. Glucose not being checked at home  HTN: his bp has been elevated at urgent care and also at his in-laws house, usually around 140's/90's. He denies chest pain or palpitation.   Hyperlipidemia: he is taking Atorvastatin and denies muscle pain, no chest pain , reviewed labs with patient.   Obesity: he is still thinking about bariatric surgery, he was very sick this week with sore throat and not eating much, so he has lost a lot of weight since last visit ( 18 lbs total)   OSA: using CPAP machine every night, all night, he has one machine for the house and one for his truck. Feels more rested, he has also noticed that he can dream now. He denies headaches when he wakes up but is waking up feeling tired, but doing well throughout the day  Major Depression:he was feeling down, back on Effexor and is feeling better, no angry spells, no crying spells, he is still feels tired all the time ( his baseline), denies sadness or hopelessness at this time. He is compliant with medication.   Mouth ulcer and sore throat: went to urgent last week, he was given amoxicilin and is feeling better, he still has a dull sore throat, but not as bad. No fever or chills, but has nasal congestion  Patient Active Problem List   Diagnosis Date  Noted  . Anemia, unspecified 12/17/2016  . Mild episode of recurrent major depressive disorder ([place]) 12/29/2015  . Diabetes mellitus type II, controlled ([place]) 04/30/2015  . Circadian rhythm sleep disorder, shift work type 04/23/2015  . Dyslipidemia 04/23/2015  . H/O lumbosacral spine surgery 04/23/2015  . H/O umbilical hernia repair 83/38/2505  . Dysmetabolic syndrome 39/76/7341  . Extreme obesity 04/23/2015  . ED (erectile dysfunction) of organic origin 04/23/2015  . Obstructive apnea 04/23/2015  . Vitamin D deficiency 04/23/2015  . Decreased testosterone level 04/23/2015  . Gastro-esophageal reflux disease without esophagitis 08/22/2009    Past Surgical History:  Procedure Laterality Date  . microdecompression lumbar spine  02/2011   L4-5 and L5-S1 by Dr. Babette Relic  . UMBILICAL HERNIA REPAIR  07/10/2011    Family History  Problem Relation Age of Onset  . Diabetes Mother   . Hypertension Father   . CAD Father   . Hypertension Sister   . Hypertension Daughter   . ADD / ADHD Son     Social History   Socioeconomic History  . Marital status: Married    Spouse name: Not on file  . Number of children: Not on file  . Years of education: Not on file  . Highest education level: Not on file  Social Needs  . Financial resource strain: Not on file  .  Food insecurity - worry: Not on file  . Food insecurity - inability: Not on file  . Transportation needs - medical: Not on file  . Transportation needs - non-medical: Not on file  Occupational History  . Not on file  Tobacco Use  . Smoking status: Former Smoker    Packs/day: 1.00    Years: 31.00    Pack years: 31.00    Types: Cigarettes    Start date: 10/28/1980    Last attempt to quit: 06/28/2012    Years since quitting: 5.2  . Smokeless tobacco: Never Used  Substance and Sexual Activity  . Alcohol use: Yes    Alcohol/week: 0.0 oz    Comment: rarely-a beer a month  . Drug use: No  . Sexual activity: Yes    Partners:  Female  Other Topics Concern  . Not on file  Social History Narrative  . Not on file     Current Outpatient Medications:  .  aspirin 81 MG tablet, Take 1 tablet by mouth daily., Disp: , Rfl:  .  atorvastatin (LIPITOR) 40 MG tablet, Take 1 tablet (40 mg total) by mouth daily., Disp: 90 tablet, Rfl: 1 .  Cholecalciferol (VITAMIN D3) 2000 UNITS capsule, Take 1 capsule by mouth daily., Disp: , Rfl:  .  ibuprofen (ADVIL,MOTRIN) 200 MG tablet, Take 2 tablets by mouth as needed., Disp: , Rfl:  .  metFORMIN (GLUCOPHAGE XR) 750 MG 24 hr tablet, Take 1 tablet (750 mg total) by mouth daily with breakfast., Disp: 180 tablet, Rfl: 1 .  omeprazole (PRILOSEC) 20 MG capsule, Take 1 capsule (20 mg total) by mouth daily., Disp: 90 capsule, Rfl: 1 .  pantoprazole (PROTONIX) 40 MG tablet, TAKE 1 TABLET BY MOUTH  DAILY, Disp: 90 tablet, Rfl: 0 .  venlafaxine (EFFEXOR) 37.5 MG tablet, TAKE 1 TO 2 TABLETS BY MOUTH TWICE DAILY. START WITH ONE TABLET FOR THE FIRST WEEK. AFTER THAT, TAKE 2 IN PLACE OF CYMBALTA., Disp: 60 tablet, Rfl: 0 .  hydrochlorothiazide (HYDRODIURIL) 12.5 MG tablet, Take 1 tablet (12.5 mg total) daily by mouth., Disp: 30 tablet, Rfl: 0  Allergies  Allergen Reactions  . Effexor Xr [Venlafaxine Hcl] Other (See Comments)    Unstable moods  . Trulicity [Dulaglutide] Nausea Only    And diarrhea     ROS  Constitutional: Negative for fever , positive weight change.  Respiratory: Negative for cough and shortness of breath.   Cardiovascular: Negative for chest pain or palpitations.  Gastrointestinal: Negative for abdominal pain, no bowel changes.  Musculoskeletal: Negative for gait problem or joint swelling.  Skin: Negative for rash.  Neurological: Negative for dizziness or headache.  No other specific complaints in a complete review of systems (except as listed in HPI above).  Objective  Vitals:   09/12/17 0744  BP: (!) 150/100  Pulse: 93  Resp: 14  SpO2: 97%  Weight: (!) 317 lb  (143.8 kg)  Height: 5\' 10"  (1.778 m)    Body mass index is 45.48 kg/m.  Physical Exam  Constitutional: Patient appears well-developed and well-nourished. Obese No distress.  HEENT: head atraumatic, normocephalic, pupils equal and reactive to light, neck supple, throat within normal limits, ulceration on the posterior pharynx, tonsil 2 plus with mild erythema Cardiovascular: Normal rate, regular rhythm and normal heart sounds.  No murmur heard. No BLE edema. Pulmonary/Chest: Effort normal and breath sounds normal. No respiratory distress. Abdominal: Soft.  There is no tenderness. Psychiatric: Patient has a normal mood and affect. behavior is normal.  Judgment and thought content normal.  Recent Results (from the past 2160 hour(s))  CBC With Differential     Status: Abnormal   Collection Time: 06/20/17 10:00 AM  Result Value Ref Range   WBC 7.2 3.4 - 10.8 x10E3/uL   RBC 4.83 4.14 - 5.80 x10E6/uL   Hemoglobin 12.6 (L) 13.0 - 17.7 g/dL   Hematocrit 38.8 37.5 - 51.0 %   MCV 80 79 - 97 fL   MCH 26.1 (L) 26.6 - 33.0 pg   MCHC 32.5 31.5 - 35.7 g/dL   RDW 15.0 12.3 - 15.4 %   Neutrophils 53 Not Estab. %   Lymphs 33 Not Estab. %   Monocytes 9 Not Estab. %   Eos 4 Not Estab. %   Basos 1 Not Estab. %   Neutrophils Absolute 3.9 1.4 - 7.0 x10E3/uL   Lymphocytes Absolute 2.3 0.7 - 3.1 x10E3/uL   Monocytes Absolute 0.6 0.1 - 0.9 x10E3/uL   EOS (ABSOLUTE) 0.3 0.0 - 0.4 x10E3/uL   Basophils Absolute 0.1 0.0 - 0.2 x10E3/uL   Immature Granulocytes 0 Not Estab. %   Immature Grans (Abs) 0.0 0.0 - 0.1 x10E3/uL  Comp. Metabolic Panel (12)     Status: Abnormal   Collection Time: 06/20/17 10:00 AM  Result Value Ref Range   Glucose 104 (H) 65 - 99 mg/dL   BUN 12 6 - 24 mg/dL   Creatinine, Ser 0.97 0.76 - 1.27 mg/dL   GFR calc non Af Amer 92 >59 mL/min/1.73   GFR calc Af Amer 106 >59 mL/min/1.73   BUN/Creatinine Ratio 12 9 - 20   Sodium 140 134 - 144 mmol/L   Potassium 5.1 3.5 - 5.2 mmol/L    Chloride 99 96 - 106 mmol/L   Calcium 9.1 8.7 - 10.2 mg/dL   Total Protein 7.2 6.0 - 8.5 g/dL   Albumin 4.2 3.5 - 5.5 g/dL   Globulin, Total 3.0 1.5 - 4.5 g/dL   Albumin/Globulin Ratio 1.4 1.2 - 2.2   Bilirubin Total 0.6 0.0 - 1.2 mg/dL   Alkaline Phosphatase 86 39 - 117 IU/L   AST 34 0 - 40 IU/L  Lipid panel     Status: Abnormal   Collection Time: 06/20/17 10:00 AM  Result Value Ref Range   Cholesterol, Total 133 100 - 199 mg/dL   Triglycerides 131 0 - 149 mg/dL   HDL 31 (L) >39 mg/dL   VLDL Cholesterol Cal 26 5 - 40 mg/dL   LDL Calculated 76 0 - 99 mg/dL   Chol/HDL Ratio 4.3 0.0 - 5.0 ratio    Comment:                                   T. Chol/HDL Ratio                                             Men  Women                               1/2 Avg.Risk  3.4    3.3  Avg.Risk  5.0    4.4                                2X Avg.Risk  9.6    7.1                                3X Avg.Risk 23.4   11.0   Hemoglobin A1c     Status: Abnormal   Collection Time: 06/20/17 10:00 AM  Result Value Ref Range   Hgb A1c MFr Bld 6.2 (H) 4.8 - 5.6 %    Comment:          Prediabetes: 5.7 - 6.4          Diabetes: >6.4          Glycemic control for adults with diabetes: <7.0    Est. average glucose Bld gHb Est-mCnc 131 mg/dL  VITAMIN D 25 Hydroxy (Vit-D Deficiency, Fractures)     Status: None   Collection Time: 06/20/17 10:00 AM  Result Value Ref Range   Vit D, 25-Hydroxy 36.7 30.0 - 100.0 ng/mL    Comment: Vitamin D deficiency has been defined by the [place] and an Endocrine Society practice guideline as a level of serum 25-OH vitamin D less than 20 ng/mL (1,2). The Endocrine Society went on to further define vitamin D insufficiency as a level between 21 and 29 ng/mL (2). 1. IOM (Institute of Medicine). 2010. Dietary reference    intakes for calcium and D. [place]: The    [employer]. 2. Holick MF, Binkley [place], Bischoff-Ferrari HA,  et al.    Evaluation, treatment, and prevention of vitamin D    deficiency: an Endocrine Society clinical practice    guideline. JCEM. 2011 Jul; 96(7):1911-30.   Vitamin B12     Status: None   Collection Time: 06/20/17 10:00 AM  Result Value Ref Range   Vitamin B-12 411 232 - 1,245 pg/mL  POCT HgB A1C     Status: Abnormal   Collection Time: 09/12/17  8:04 AM  Result Value Ref Range   Hemoglobin A1C 6.8       PHQ2/9: Depression screen [facility] 2/9 06/20/2017 06/20/2017 03/17/2017 12/13/2016 09/13/2016  Decreased Interest 0 1 0 0 0  Down, Depressed, Hopeless 1 1 0 0 0  PHQ - 2 Score 1 2 0 0 0  Altered sleeping 3 1 - - -  Tired, decreased energy 2 0 - - -  Change in appetite 0 0 - - -  Feeling bad or failure about yourself  0 0 - - -  Trouble concentrating 0 0 - - -  Moving slowly or fidgety/restless 0 0 - - -  Suicidal thoughts 0 0 - - -  PHQ-9 Score 6 3 - - -  Difficult doing work/chores Not difficult at all Not difficult at all - - -     Fall Risk: Fall Risk  09/12/2017 06/20/2017 03/17/2017 12/13/2016 09/13/2016  Falls in the past year? No Yes No No No  Number falls in past yr: - 2 or more - - -  Injury with Fall? - No - - -  Follow up - Falls evaluation completed - - -      Assessment & Plan  1. Controlled type 2 diabetes with neuropathy (HCC)  - POCT HgB A1C  2. Hypertension, uncontrolled  We will  check labs, start HCTZ and return in 2 weeks for follow up - Comprehensive metabolic panel - CBC with Differential/Platelet - Thyroid Panel With TSH - Urine Microalbumin w/creat. ratio - Parathyroid hormone, intact (no Ca) - hydrochlorothiazide (HYDRODIURIL) 12.5 MG tablet; Take 1 tablet (12.5 mg total) daily by mouth.  Dispense: 30 tablet; Refill: 0  3. Obstructive apnea   4. Vitamin D deficiency   5. Mild episode of recurrent major depressive disorder ([place])  He asked to stop at one point, but asked to go back on it, states it is the best one for him  6.  Dyslipidemia   7. Extreme obesity  Going to have bariatric surgery   8. Anemia, unspecified type  - CBC with Differential/Platelet - Iron, TIBC and Ferritin Panel; Future

## 2017-09-13 LAB — COMPREHENSIVE METABOLIC PANEL
ALT: 64 IU/L — ABNORMAL HIGH (ref 0–44)
AST: 62 IU/L — ABNORMAL HIGH (ref 0–40)
Albumin/Globulin Ratio: 1.1 — ABNORMAL LOW (ref 1.2–2.2)
Albumin: 4.1 g/dL (ref 3.5–5.5)
Alkaline Phosphatase: 77 IU/L (ref 39–117)
BUN/Creatinine Ratio: 13 (ref 9–20)
BUN: 13 mg/dL (ref 6–24)
Bilirubin Total: 0.4 mg/dL (ref 0.0–1.2)
CO2: 27 mmol/L (ref 20–29)
Calcium: 9.7 mg/dL (ref 8.7–10.2)
Chloride: 99 mmol/L (ref 96–106)
Creatinine, Ser: 0.97 mg/dL (ref 0.76–1.27)
GFR calc Af Amer: 106 mL/min/{1.73_m2} (ref >59)
GFR calc non Af Amer: 92 mL/min/{1.73_m2} (ref >59)
Globulin, Total: 3.6 g/dL (ref 1.5–4.5)
Glucose: 102 mg/dL — ABNORMAL HIGH (ref 65–99)
Potassium: 4.7 mmol/L (ref 3.5–5.2)
Sodium: 140 mmol/L (ref 134–144)
Total Protein: 7.7 g/dL (ref 6.0–8.5)

## 2017-09-13 LAB — CBC WITH DIFFERENTIAL/PLATELET
Basophils Absolute: 0 10*3/uL (ref 0.0–0.2)
Basos: 1 %
EOS (ABSOLUTE): 0.2 10*3/uL (ref 0.0–0.4)
Eos: 3 %
Hematocrit: 39.8 % (ref 37.5–51.0)
Hemoglobin: 12.9 g/dL — ABNORMAL LOW (ref 13.0–17.7)
Immature Grans (Abs): 0 10*3/uL (ref 0.0–0.1)
Immature Granulocytes: 1 %
Lymphocytes Absolute: 2.2 10*3/uL (ref 0.7–3.1)
Lymphs: 34 %
MCH: 26.7 pg (ref 26.6–33.0)
MCHC: 32.4 g/dL (ref 31.5–35.7)
MCV: 82 fL (ref 79–97)
Monocytes Absolute: 0.5 10*3/uL (ref 0.1–0.9)
Monocytes: 8 %
Neutrophils Absolute: 3.5 10*3/uL (ref 1.4–7.0)
Neutrophils: 53 %
Platelets: 381 10*3/uL — ABNORMAL HIGH (ref 150–379)
RBC: 4.84 x10E6/uL (ref 4.14–5.80)
RDW: 14.7 % (ref 12.3–15.4)
WBC: 6.5 10*3/uL (ref 3.4–10.8)

## 2017-09-13 LAB — MICROALBUMIN / CREATININE URINE RATIO
Creatinine, Urine: 287.1 mg/dL
Microalb/Creat Ratio: 3.9 mg/g creat (ref 0.0–30.0)
Microalbumin, Urine: 11.3 ug/mL

## 2017-09-13 LAB — SPECIMEN STATUS REPORT

## 2017-09-13 LAB — THYROID PANEL WITH TSH
Free Thyroxine Index: 2.6 (ref 1.2–4.9)
T3 Uptake Ratio: 29 % (ref 24–39)
T4, Total: 8.8 ug/dL (ref 4.5–12.0)
TSH: 1.37 u[IU]/mL (ref 0.450–4.500)

## 2017-09-13 LAB — PARATHYROID HORMONE, INTACT (NO CA)

## 2017-09-15 ENCOUNTER — Encounter: Payer: Self-pay | Admitting: Family Medicine

## 2017-10-07 ENCOUNTER — Ambulatory Visit: Payer: 59 | Admitting: Family Medicine

## 2017-10-09 ENCOUNTER — Ambulatory Visit (INDEPENDENT_AMBULATORY_CARE_PROVIDER_SITE_OTHER): Payer: 59 | Admitting: Family Medicine

## 2017-10-09 ENCOUNTER — Encounter: Payer: Self-pay | Admitting: Family Medicine

## 2017-10-09 VITALS — BP 124/83 | HR 83 | Resp 14 | Ht 70.0 in | Wt 321.9 lb

## 2017-10-09 DIAGNOSIS — E118 Type 2 diabetes mellitus with unspecified complications: Secondary | ICD-10-CM

## 2017-10-09 DIAGNOSIS — D649 Anemia, unspecified: Secondary | ICD-10-CM | POA: Diagnosis not present

## 2017-10-09 DIAGNOSIS — D473 Essential (hemorrhagic) thrombocythemia: Secondary | ICD-10-CM

## 2017-10-09 DIAGNOSIS — F320 Major depressive disorder, single episode, mild: Secondary | ICD-10-CM

## 2017-10-09 DIAGNOSIS — M533 Sacrococcygeal disorders, not elsewhere classified: Secondary | ICD-10-CM

## 2017-10-09 DIAGNOSIS — E1140 Type 2 diabetes mellitus with diabetic neuropathy, unspecified: Secondary | ICD-10-CM

## 2017-10-09 DIAGNOSIS — I10 Essential (primary) hypertension: Secondary | ICD-10-CM | POA: Diagnosis not present

## 2017-10-09 DIAGNOSIS — E785 Hyperlipidemia, unspecified: Secondary | ICD-10-CM

## 2017-10-09 DIAGNOSIS — Z794 Long term (current) use of insulin: Secondary | ICD-10-CM | POA: Diagnosis not present

## 2017-10-09 DIAGNOSIS — D75839 Thrombocytosis, unspecified: Secondary | ICD-10-CM

## 2017-10-09 DIAGNOSIS — K219 Gastro-esophageal reflux disease without esophagitis: Secondary | ICD-10-CM

## 2017-10-09 MED ORDER — ATORVASTATIN CALCIUM 40 MG PO TABS: 40.0000 mg | ORAL_TABLET | Freq: Every day | ORAL | 1 refills | Status: DC

## 2017-10-09 MED ORDER — VENLAFAXINE HCL ER 75 MG PO CP24: 75.0000 mg | ORAL_CAPSULE | Freq: Every day | ORAL | 1 refills | Status: DC

## 2017-10-09 MED ORDER — PANTOPRAZOLE SODIUM 40 MG PO TBEC: 40.0000 mg | DELAYED_RELEASE_TABLET | Freq: Every day | ORAL | 1 refills | Status: DC

## 2017-10-09 MED ORDER — METFORMIN HCL ER 750 MG PO TB24: 750.0000 mg | ORAL_TABLET | Freq: Every day | ORAL | 1 refills | Status: DC

## 2017-10-09 NOTE — Progress Notes (Signed)
Name: Jimmy Bryant   MRN: [id number]    DOB: 1971/09/30   Date:10/09/2017       Progress Note  Subjective  Chief Complaint  Chief Complaint  Patient presents with  . Depression    HPI  Major Depression:he was feeling down, back on Effexor and is feeling better, no angry spells, no crying spells, he is still feels tired all the time ( his baseline), denies sadness or hopelessness at this time. He is compliant with medication. His PHQ9 has improved , down to 2 today.   HTN: he started taking HCTZ Nov 2018, and is tolerating medication well, bp since started on medication 120's-140's/80's  He denies chest pain or palpitation.   Anemia: normal iron studies and negative hemoccult back in June 2018, last labs also showed elevated platelets. We will recheck labs add folate and B12 and if still has anemia he agrees to see hematologist   Coccyalgia: he fell from the bed of his work truck three weeks ago , he told his supervisor about it, he did not hit his head of passed out, he fell on his buttocks. Initially the pain was 8-9 and sharp, currently down to a 3 and aching. Pain is gradually improving, aggravated by going from sitting to standing position and also when sitting and has to shift his bottom.   Patient Active Problem List   Diagnosis Date Noted  . Anemia, unspecified 12/17/2016  . Mild episode of recurrent major depressive disorder ([place]) 12/29/2015  . Diabetes mellitus type II, controlled ([place]) 04/30/2015  . Circadian rhythm sleep disorder, shift work type 04/23/2015  . Dyslipidemia 04/23/2015  . H/O lumbosacral spine surgery 04/23/2015  . H/O umbilical hernia repair 78/29/5621  . Dysmetabolic syndrome 30/86/5784  . Extreme obesity 04/23/2015  . ED (erectile dysfunction) of organic origin 04/23/2015  . Obstructive apnea 04/23/2015  . Vitamin D deficiency 04/23/2015  . Decreased testosterone level 04/23/2015  . Gastro-esophageal reflux disease without esophagitis 08/22/2009     Past Surgical History:  Procedure Laterality Date  . microdecompression lumbar spine  02/2011   L4-5 and L5-S1 by Dr. Babette Relic  . UMBILICAL HERNIA REPAIR  07/10/2011    Family History  Problem Relation Age of Onset  . Diabetes Mother   . Hypertension Father   . CAD Father   . Hypertension Sister   . Hypertension Daughter   . ADD / ADHD Son     Social History   Socioeconomic History  . Marital status: Married    Spouse name: Not on file  . Number of children: Not on file  . Years of education: Not on file  . Highest education level: Not on file  Social Needs  . Financial resource strain: Not on file  . Food insecurity - worry: Not on file  . Food insecurity - inability: Not on file  . Transportation needs - medical: Not on file  . Transportation needs - non-medical: Not on file  Occupational History  . Not on file  Tobacco Use  . Smoking status: Former Smoker    Packs/day: 1.00    Years: 31.00    Pack years: 31.00    Types: Cigarettes    Start date: 10/28/1980    Last attempt to quit: 06/28/2012    Years since quitting: 5.2  . Smokeless tobacco: Never Used  Substance and Sexual Activity  . Alcohol use: Yes    Alcohol/week: 0.0 oz    Comment: rarely-a beer a month  . Drug use:  No  . Sexual activity: Yes    Partners: Female  Other Topics Concern  . Not on file  Social History Narrative  . Not on file     Current Outpatient Medications:  .  aspirin 81 MG tablet, Take 1 tablet by mouth daily., Disp: , Rfl:  .  atorvastatin (LIPITOR) 40 MG tablet, Take 1 tablet (40 mg total) by mouth daily., Disp: 90 tablet, Rfl: 1 .  Cholecalciferol (VITAMIN D3) 2000 UNITS capsule, Take 1 capsule by mouth daily., Disp: , Rfl:  .  hydrochlorothiazide (HYDRODIURIL) 12.5 MG tablet, Take 1 tablet (12.5 mg total) daily by mouth., Disp: 30 tablet, Rfl: 0 .  ibuprofen (ADVIL,MOTRIN) 200 MG tablet, Take 2 tablets by mouth as needed., Disp: , Rfl:  .  metFORMIN (GLUCOPHAGE XR) 750 MG  24 hr tablet, Take 1 tablet (750 mg total) by mouth daily with breakfast., Disp: 180 tablet, Rfl: 1 .  omeprazole (PRILOSEC) 20 MG capsule, Take 1 capsule (20 mg total) by mouth daily., Disp: 90 capsule, Rfl: 1 .  pantoprazole (PROTONIX) 40 MG tablet, TAKE 1 TABLET BY MOUTH  DAILY, Disp: 90 tablet, Rfl: 0 .  venlafaxine XR (EFFEXOR-XR) 75 MG 24 hr capsule, Take 1 capsule (75 mg total) daily with breakfast by mouth., Disp: 90 capsule, Rfl: 0  Allergies  Allergen Reactions  . Trulicity [Dulaglutide] Nausea Only    And diarrhea     ROS  Constitutional: Negative for fever , positive for mild  weight change.  Respiratory: Negative for cough and shortness of breath.   Cardiovascular: Negative for chest pain or palpitations.  Gastrointestinal: Negative for abdominal pain, no bowel changes.  Musculoskeletal: Negative for gait problem or joint swelling.  Skin: Negative for rash.  Neurological: Negative for dizziness or headache.  No other specific complaints in a complete review of systems (except as listed in HPI above).  Objective  Vitals:   10/09/17 0805  BP: 124/83  Pulse: 83  Resp: 14  SpO2: 98%  Weight: (!) 321 lb 14.4 oz (146 kg)  Height: 5\' 10"  (1.778 m)    Body mass index is 46.19 kg/m.  Physical Exam  Constitutional: Patient appears well-developed and well-nourished. Obese  No distress.  HEENT: head atraumatic, normocephalic, pupils equal and reactive to light,  neck supple, throat within normal limits Cardiovascular: Normal rate, regular rhythm and normal heart sounds.  No murmur heard. No BLE edema. Pulmonary/Chest: Effort normal and breath sounds normal. No respiratory distress. Abdominal: Soft.  There is no tenderness. Psychiatric: Patient has a normal mood and affect. behavior is normal. Judgment and thought content normal. Muscular Skeletal: pain during palpation of coccygeal area, negative straight leg raise, normal gait   Recent Results (from the past 2160  hour(s))  Comprehensive Metabolic Panel (CMET)     Status: Abnormal   Collection Time: 09/12/17 12:00 AM  Result Value Ref Range   Glucose 102 (H) 65 - 99 mg/dL   BUN 13 6 - 24 mg/dL   Creatinine, Ser 0.97 0.76 - 1.27 mg/dL   GFR calc non Af Amer 92 >59 mL/min/1.73   GFR calc Af Amer 106 >59 mL/min/1.73   BUN/Creatinine Ratio 13 9 - 20   Sodium 140 134 - 144 mmol/L   Potassium 4.7 3.5 - 5.2 mmol/L   Chloride 99 96 - 106 mmol/L   CO2 27 20 - 29 mmol/L   Calcium 9.7 8.7 - 10.2 mg/dL   Total Protein 7.7 6.0 - 8.5 g/dL   Albumin 4.1  3.5 - 5.5 g/dL   Globulin, Total 3.6 1.5 - 4.5 g/dL   Albumin/Globulin Ratio 1.1 (L) 1.2 - 2.2   Bilirubin Total 0.4 0.0 - 1.2 mg/dL   Alkaline Phosphatase 77 39 - 117 IU/L   AST 62 (H) 0 - 40 IU/L   ALT 64 (H) 0 - 44 IU/L  CBC with Differential/Platelet     Status: Abnormal   Collection Time: 09/12/17 12:00 AM  Result Value Ref Range   WBC 6.5 3.4 - 10.8 x10E3/uL   RBC 4.84 4.14 - 5.80 x10E6/uL   Hemoglobin 12.9 (L) 13.0 - 17.7 g/dL   Hematocrit 39.8 37.5 - 51.0 %   MCV 82 79 - 97 fL   MCH 26.7 26.6 - 33.0 pg   MCHC 32.4 31.5 - 35.7 g/dL   RDW 14.7 12.3 - 15.4 %   Platelets 381 (H) 150 - 379 x10E3/uL   Neutrophils 53 Not Estab. %   Lymphs 34 Not Estab. %   Monocytes 8 Not Estab. %   Eos 3 Not Estab. %   Basos 1 Not Estab. %   Neutrophils Absolute 3.5 1.4 - 7.0 x10E3/uL   Lymphocytes Absolute 2.2 0.7 - 3.1 x10E3/uL   Monocytes Absolute 0.5 0.1 - 0.9 x10E3/uL   EOS (ABSOLUTE) 0.2 0.0 - 0.4 x10E3/uL   Basophils Absolute 0.0 0.0 - 0.2 x10E3/uL   Immature Granulocytes 1 Not Estab. %   Immature Grans (Abs) 0.0 0.0 - 0.1 x10E3/uL  Thyroid Panel With TSH     Status: None   Collection Time: 09/12/17 12:00 AM  Result Value Ref Range   TSH 1.370 0.450 - 4.500 uIU/mL   T4, Total 8.8 4.5 - 12.0 ug/dL   T3 Uptake Ratio 29 24 - 39 %   Free Thyroxine Index 2.6 1.2 - 4.9  Urine Microalbumin w/creat. ratio     Status: None   Collection Time: 09/12/17  12:00 AM  Result Value Ref Range   Creatinine, Urine 287.1 Not Estab. mg/dL   Albumin, Urine 11.3 Not Estab. ug/mL   Microalb/Creat Ratio 3.9 0.0 - 30.0 mg/g creat    Comment:                      Normal:                0.0 -  30.0                      Albuminuria:          31.0 - 300.0                      Clinical albuminuria:       >300.0   Parathyroid hormone, intact (no Ca)     Status: None   Collection Time: 09/12/17 12:00 AM  Result Value Ref Range   PTH CANCELED pg/mL    Comment: No plasma specimen received.  Result canceled by the ancillary.   Specimen status report     Status: None   Collection Time: 09/12/17 12:00 AM  Result Value Ref Range   specimen status report Comment     Comment: Please note Please note The date and/or time of collection was not indicated on the requisition as required by state and federal law.  The date of receipt of the specimen was used as the collection date if not supplied.   POCT HgB A1C     Status: Abnormal  Collection Time: 09/12/17  8:04 AM  Result Value Ref Range   Hemoglobin A1C 6.8      PHQ2/9: Depression screen [facility] 2/9 06/20/2017 06/20/2017 03/17/2017 12/13/2016 09/13/2016  Decreased Interest 0 1 0 0 0  Down, Depressed, Hopeless 1 1 0 0 0  PHQ - 2 Score 1 2 0 0 0  Altered sleeping 3 1 - - -  Tired, decreased energy 2 0 - - -  Change in appetite 0 0 - - -  Feeling bad or failure about yourself  0 0 - - -  Trouble concentrating 0 0 - - -  Moving slowly or fidgety/restless 0 0 - - -  Suicidal thoughts 0 0 - - -  PHQ-9 Score 6 3 - - -  Difficult doing work/chores Not difficult at all Not difficult at all - - -    Fall Risk: Fall Risk  10/09/2017 09/12/2017 06/20/2017 03/17/2017 12/13/2016  Falls in the past year? Yes No Yes No No  Number falls in past yr: 1 - 2 or more - -  Injury with Fall? Yes - No - -  Follow up - - Falls evaluation completed - -     Functional Status Survey: Is the patient deaf or have difficulty  hearing?: No Does the patient have difficulty seeing, even when wearing glasses/contacts?: No Does the patient have difficulty concentrating, remembering, or making decisions?: No Does the patient have difficulty walking or climbing stairs?: No Does the patient have difficulty dressing or bathing?: No Does the patient have difficulty doing errands alone such as visiting a doctor's office or shopping?: No    Assessment & Plan  1. Hypertension, uncontrolled  Doing well, he does not have microalbuminuria, and will have bariatric surgery soon, so we will not add ace/arb at this time - CBC with Differential/Platelet - PTH, Intact and Calcium  2. Coccygeal pain, acute  Advised to take Tylenol prn   3. Anemia, unspecified type  - CBC with Differential/Platelet - Folate - Methylmalonic Acid, Serum  4. Thrombocytosis (HCC)  - CBC with Differential/Platelet  5. Gastroesophageal reflux disease without esophagitis  - pantoprazole (PROTONIX) 40 MG tablet; Take 1 tablet (40 mg total) by mouth daily.  Dispense: 90 tablet; Refill: 1  6. Dyslipidemia  - atorvastatin (LIPITOR) 40 MG tablet; Take 1 tablet (40 mg total) by mouth daily.  Dispense: 90 tablet; Refill: 1  7. Controlled type 2 diabetes with neuropathy (HCC)  - metFORMIN (GLUCOPHAGE XR) 750 MG 24 hr tablet; Take 1 tablet (750 mg total) by mouth daily with breakfast.  Dispense: 180 tablet; Refill: 1  8. Controlled type 2 diabetes mellitus with complication, with long-term current use of insulin (HCC)  - metFORMIN (GLUCOPHAGE XR) 750 MG 24 hr tablet; Take 1 tablet (750 mg total) by mouth daily with breakfast.  Dispense: 180 tablet; Refill: 1  9. Mild major depression (HCC)  - venlafaxine XR (EFFEXOR-XR) 75 MG 24 hr capsule; Take 1 capsule (75 mg total) by mouth daily with breakfast.  Dispense: 90 capsule; Refill: 1

## 2017-10-14 LAB — CBC WITH DIFFERENTIAL/PLATELET
Basophils Absolute: 0 10*3/uL (ref 0.0–0.2)
Basos: 1 %
EOS (ABSOLUTE): 0.2 10*3/uL (ref 0.0–0.4)
Eos: 3 %
Hematocrit: 39.7 % (ref 37.5–51.0)
Hemoglobin: 13.2 g/dL (ref 13.0–17.7)
Immature Grans (Abs): 0 10*3/uL (ref 0.0–0.1)
Immature Granulocytes: 0 %
Lymphocytes Absolute: 2.2 10*3/uL (ref 0.7–3.1)
Lymphs: 33 %
MCH: 27 pg (ref 26.6–33.0)
MCHC: 33.2 g/dL (ref 31.5–35.7)
MCV: 81 fL (ref 79–97)
Monocytes Absolute: 0.5 10*3/uL (ref 0.1–0.9)
Monocytes: 8 %
Neutrophils Absolute: 3.6 10*3/uL (ref 1.4–7.0)
Neutrophils: 55 %
Platelets: 290 10*3/uL (ref 150–379)
RBC: 4.88 x10E6/uL (ref 4.14–5.80)
RDW: 15.4 % (ref 12.3–15.4)
WBC: 6.6 10*3/uL (ref 3.4–10.8)

## 2017-10-14 LAB — PTH, INTACT AND CALCIUM
Calcium: 9.4 mg/dL (ref 8.7–10.2)
PTH: 15 pg/mL (ref 15–65)

## 2017-10-14 LAB — METHYLMALONIC ACID, SERUM: Methylmalonic Acid: 206 nmol/L (ref 0–378)

## 2017-10-14 LAB — FOLATE: Folate: 10.9 ng/mL (ref >3.0)

## 2017-10-31 ENCOUNTER — Other Ambulatory Visit: Payer: Self-pay | Admitting: Family Medicine

## 2017-11-24 ENCOUNTER — Encounter: Payer: Self-pay | Admitting: Family Medicine

## 2017-11-24 ENCOUNTER — Other Ambulatory Visit: Payer: Self-pay | Admitting: Family Medicine

## 2017-11-24 DIAGNOSIS — I10 Essential (primary) hypertension: Secondary | ICD-10-CM

## 2017-11-24 MED ORDER — HYDROCHLOROTHIAZIDE 12.5 MG PO TABS: 12.5000 mg | ORAL_TABLET | Freq: Every day | ORAL | 1 refills | Status: DC

## 2017-11-28 ENCOUNTER — Other Ambulatory Visit: Payer: Self-pay | Admitting: Family Medicine

## 2017-11-28 DIAGNOSIS — E785 Hyperlipidemia, unspecified: Secondary | ICD-10-CM

## 2017-11-28 DIAGNOSIS — K219 Gastro-esophageal reflux disease without esophagitis: Secondary | ICD-10-CM

## 2018-01-09 ENCOUNTER — Encounter: Payer: Self-pay | Admitting: Family Medicine

## 2018-01-09 ENCOUNTER — Ambulatory Visit: Payer: 59 | Admitting: Family Medicine

## 2018-01-09 VITALS — BP 136/78 | HR 88 | Temp 97.7°F | Resp 16 | Ht 70.0 in | Wt 328.1 lb

## 2018-01-09 DIAGNOSIS — F320 Major depressive disorder, single episode, mild: Secondary | ICD-10-CM | POA: Diagnosis not present

## 2018-01-09 DIAGNOSIS — E118 Type 2 diabetes mellitus with unspecified complications: Secondary | ICD-10-CM | POA: Diagnosis not present

## 2018-01-09 DIAGNOSIS — Z794 Long term (current) use of insulin: Secondary | ICD-10-CM

## 2018-01-09 DIAGNOSIS — G4733 Obstructive sleep apnea (adult) (pediatric): Secondary | ICD-10-CM | POA: Diagnosis not present

## 2018-01-09 DIAGNOSIS — I10 Essential (primary) hypertension: Secondary | ICD-10-CM | POA: Diagnosis not present

## 2018-01-09 LAB — POCT GLYCOSYLATED HEMOGLOBIN (HGB A1C): Hemoglobin A1C: 6.5

## 2018-01-09 MED ORDER — HYDROCHLOROTHIAZIDE 12.5 MG PO TABS: 12.5000 mg | ORAL_TABLET | Freq: Every day | ORAL | 1 refills | Status: DC

## 2018-01-09 NOTE — Progress Notes (Signed)
Name: Jimmy Bryant   MRN: [id number]    DOB: [birth year]   Date:01/09/2018       Progress Note  Subjective  Chief Complaint  Chief Complaint  Patient presents with  . Medication Refill    3 month F/U  . Hypertension    Edema in bilateral feet-but worst in left foot  . Diabetes    Diet controlled-does not check his sugar at home  . Anemia  . Depression    Controlled with medication    HPI  DM with ED:He stopped drinking sodas for breakfast, he is drinking diet tea. He deniespolydipsia , polyuria or polyphagia. He was on Trucility because of nausea and diarrhea, going to have bariatric surgery, he is back on Metformin at this time. HgbA1C was 6.2 went up to 6.8% down to 6.5% today . Taking aspirin daily. Eye exam is up to date. Not on ED medication because of cost. Glucose not being checked at home, no low sugars  HTN: his bp has been controlled with HCTZ, no chest pain or palpitation, he states bp at home has been controlled also, not sure of readings.   Hyperlipidemia: he is taking Atorvastatin and denies muscle pain, no chest pain, reviewed labs with patient.   Obesity:he still wants to have surgery, but postponed because of finance problems, he gained weight since last visit.   OSA: using CPAP machine every night, all night, he has one machine for the house and one for his truck. Feels more rested, he has also noticed that he can dream now. He denies headaches , waking up feeling rested and feels well during the day   Major Depression:he was feeling down, back on Effexor and is feeling better, no angry spells, no crying spells, he is still feels tired all the time ( his baseline), denies sadness or hopelessness at this time. He is compliant with medication. He states that the rain is bothering him some, but wants to continue current dose of medication   Low back pain after a fall: he was at work and fell backwards on his tailbone about one month ago. Pain was intense  from the beginning. However kept working, he has a history of back surgery and follow up with his surgeon today. Pain is on left lower back, left buttocks, posterior thigh, lateral lower leg and bottom of foot is numb. Pain is constant worse with movement. It is affecting his gait. Advised him to discuss with HR about Workman's comp   Patient Active Problem List   Diagnosis Date Noted  . Anemia, unspecified 12/17/2016  . Mild episode of recurrent major depressive disorder ([place]) 12/29/2015  . Diabetes mellitus type II, controlled ([place]) 04/30/2015  . Circadian rhythm sleep disorder, shift work type 04/23/2015  . Dyslipidemia 04/23/2015  . H/O lumbosacral spine surgery 04/23/2015  . H/O umbilical hernia repair 81/44/8185  . Dysmetabolic syndrome 63/14/9702  . Extreme obesity 04/23/2015  . ED (erectile dysfunction) of organic origin 04/23/2015  . Obstructive apnea 04/23/2015  . Vitamin D deficiency 04/23/2015  . Decreased testosterone level 04/23/2015  . Gastro-esophageal reflux disease without esophagitis 08/22/2009    Past Surgical History:  Procedure Laterality Date  . microdecompression lumbar spine  02/2011   L4-5 and L5-S1 by Dr. Babette Relic  . UMBILICAL HERNIA REPAIR  07/10/2011    Family History  Problem Relation Age of Onset  . Diabetes Mother   . Hypertension Father   . CAD Father   . Hypertension Sister   .  Hypertension Daughter   . ADD / ADHD Son     Social History   Socioeconomic History  . Marital status: Married    Spouse name: Not on file  . Number of children: Not on file  . Years of education: Not on file  . Highest education level: Not on file  Social Needs  . Financial resource strain: Not on file  . Food insecurity - worry: Not on file  . Food insecurity - inability: Not on file  . Transportation needs - medical: Not on file  . Transportation needs - non-medical: Not on file  Occupational History  . Not on file  Tobacco Use  . Smoking status: Former  Smoker    Packs/day: 1.00    Years: 31.00    Pack years: 31.00    Types: Cigarettes    Start date: 10/28/1980    Last attempt to quit: 06/28/2012    Years since quitting: 5.5  . Smokeless tobacco: Never Used  Substance and Sexual Activity  . Alcohol use: Yes    Alcohol/week: 0.0 oz    Comment: rarely-a beer a month  . Drug use: No  . Sexual activity: Yes    Partners: Female  Other Topics Concern  . Not on file  Social History Narrative  . Not on file     Current Outpatient Medications:  .  aspirin 81 MG tablet, Take 1 tablet by mouth daily., Disp: , Rfl:  .  atorvastatin (LIPITOR) 40 MG tablet, TAKE 1 TABLET BY MOUTH  DAILY, Disp: 90 tablet, Rfl: 1 .  Cholecalciferol (VITAMIN D3) 2000 UNITS capsule, Take 1 capsule by mouth daily., Disp: , Rfl:  .  hydrochlorothiazide (HYDRODIURIL) 12.5 MG tablet, Take 1 tablet (12.5 mg total) by mouth daily., Disp: 30 tablet, Rfl: 1 .  ibuprofen (ADVIL,MOTRIN) 200 MG tablet, Take 2 tablets by mouth as needed., Disp: , Rfl:  .  metFORMIN (GLUCOPHAGE XR) 750 MG 24 hr tablet, Take 1 tablet (750 mg total) by mouth daily with breakfast., Disp: 180 tablet, Rfl: 1 .  pantoprazole (PROTONIX) 40 MG tablet, TAKE 1 TABLET BY MOUTH  DAILY, Disp: 90 tablet, Rfl: 1 .  venlafaxine XR (EFFEXOR-XR) 75 MG 24 hr capsule, Take 1 capsule (75 mg total) by mouth daily with breakfast., Disp: 90 capsule, Rfl: 1  Allergies  Allergen Reactions  . Trulicity [Dulaglutide] Nausea Only    And diarrhea     ROS  Constitutional: Negative for fever , positive for mild weight change.  Respiratory: Positive  for mild cough ( recovering from an URI)  But no shortness of breath.   Cardiovascular: Negative for chest pain or palpitations.  Gastrointestinal: Negative for abdominal pain, no bowel changes.  Musculoskeletal: Positive for gait problem but no  joint swelling.  Skin: Negative for rash.  Neurological: Negative for dizziness or headache.  No other specific complaints in  a complete review of systems (except as listed in HPI above).  Objective  Vitals:   01/09/18 0913  BP: 136/78  Pulse: 88  Resp: 16  Temp: 97.7 F (36.5 C)  TempSrc: Oral  SpO2: 95%  Weight: (!) 328 lb 1.6 oz (148.8 kg)  Height: 5\' 10"  (1.778 m)    Body mass index is 47.08 kg/m.  Physical Exam  Constitutional: Patient appears well-developed and well-nourished. Obese No distress.  HEENT: head atraumatic, normocephalic, pupils equal and reactive to light, neck supple, throat within normal limits Cardiovascular: Normal rate, regular rhythm and normal heart sounds.  No  murmur heard. No BLE edema. Pulmonary/Chest: Effort normal and breath sounds normal. No respiratory distress. Abdominal: Soft.  There is no tenderness. Psychiatric: Patient has a normal mood and affect. behavior is normal. Judgment and thought content normal. Muscular Skeletal: positive straight leg raise, pain with flexion  Recent Results (from the past 2160 hour(s))  POCT HgB A1C     Status: None   Collection Time: 01/09/18  9:20 AM  Result Value Ref Range   Hemoglobin A1C 6.5      PHQ2/9: Depression screen [facility] 2/9 01/09/2018 01/09/2018 10/09/2017 06/20/2017 06/20/2017  Decreased Interest 0 0 0 0 1  Down, Depressed, Hopeless 0 0 0 1 1  PHQ - 2 Score 0 0 0 1 2  Altered sleeping 1 - 1 3 1   Tired, decreased energy 2 - 1 2 0  Change in appetite 1 - 0 0 0  Feeling bad or failure about yourself  0 - 0 0 0  Trouble concentrating 0 - 0 0 0  Moving slowly or fidgety/restless 0 - 0 0 0  Suicidal thoughts 0 - 0 0 0  PHQ-9 Score 4 - 2 6 3   Difficult doing work/chores - - Not difficult at all Not difficult at all Not difficult at all     Fall Risk: Fall Risk  01/09/2018 10/09/2017 09/12/2017 06/20/2017 03/17/2017  Falls in the past year? Yes Yes No Yes No  Number falls in past yr: 1 1 - 2 or more -  Injury with Fall? 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 Controlled type 2 diabetes mellitus with complication, with long-term current use of insulin (HCC)  - POCT HgB A1C  2. Hypertension, uncontrolled  - hydrochlorothiazide (HYDRODIURIL) 12.5 MG tablet; Take 1 tablet (12.5 mg total) by mouth daily.  Dispense: 90 tablet; Refill: 1  3. Morbid (severe) obesity due to excess calories [facility])  Discussed with the patient the risk posed by an increased BMI. Discussed importance of portion control, calorie counting and at least 150 minutes of physical activity weekly. Avoid sweet beverages and drink more water. Eat at least 6 servings of fruit and vegetables daily   4. Mild major depression (HCC)  Continue medication   5. Obstructive apnea  Continue CPAP

## 2018-04-17 ENCOUNTER — Ambulatory Visit: Payer: Managed Care, Other (non HMO) | Admitting: Family Medicine

## 2018-05-15 ENCOUNTER — Other Ambulatory Visit: Payer: Self-pay | Admitting: Family Medicine

## 2018-05-15 DIAGNOSIS — F320 Major depressive disorder, single episode, mild: Secondary | ICD-10-CM

## 2018-05-18 ENCOUNTER — Other Ambulatory Visit: Payer: Self-pay

## 2018-05-18 DIAGNOSIS — F320 Major depressive disorder, single episode, mild: Secondary | ICD-10-CM

## 2018-05-18 NOTE — Telephone Encounter (Signed)
Refill request was sent to Dr. Krichna Sowles for approval and submission.  

## 2018-05-19 MED ORDER — VENLAFAXINE HCL ER 75 MG PO CP24: ORAL_CAPSULE | ORAL | 0 refills | Status: DC

## 2018-05-29 ENCOUNTER — Encounter: Payer: Self-pay | Admitting: Family Medicine

## 2018-05-29 ENCOUNTER — Other Ambulatory Visit: Payer: Self-pay | Admitting: Family Medicine

## 2018-05-29 DIAGNOSIS — K219 Gastro-esophageal reflux disease without esophagitis: Secondary | ICD-10-CM

## 2018-05-29 NOTE — Telephone Encounter (Signed)
Refill request for general medication. Protonix  Last office visit: 01/09/18  Follow up on 06/15/18

## 2018-05-30 ENCOUNTER — Other Ambulatory Visit: Payer: Self-pay | Admitting: Family Medicine

## 2018-05-30 DIAGNOSIS — K219 Gastro-esophageal reflux disease without esophagitis: Secondary | ICD-10-CM

## 2018-05-30 MED ORDER — PANTOPRAZOLE SODIUM 40 MG PO TBEC: 40.0000 mg | DELAYED_RELEASE_TABLET | Freq: Every day | ORAL | 0 refills | Status: DC

## 2018-06-02 ENCOUNTER — Other Ambulatory Visit: Payer: Self-pay

## 2018-06-02 DIAGNOSIS — K219 Gastro-esophageal reflux disease without esophagitis: Secondary | ICD-10-CM

## 2018-06-02 MED ORDER — PANTOPRAZOLE SODIUM 40 MG PO TBEC: 40.0000 mg | DELAYED_RELEASE_TABLET | Freq: Every day | ORAL | 0 refills | Status: DC

## 2018-06-02 NOTE — Telephone Encounter (Signed)
Refill request for general medication. Pantoprazole to [facility] with a 90 day supply.   Last office visit 01/09/18  Follow up on 06/15/18

## 2018-06-15 ENCOUNTER — Ambulatory Visit: Payer: Managed Care, Other (non HMO) | Admitting: Family Medicine

## 2018-06-15 ENCOUNTER — Encounter: Payer: Self-pay | Admitting: Family Medicine

## 2018-06-15 VITALS — BP 118/64 | HR 75 | Temp 98.0°F | Resp 16 | Ht 70.0 in | Wt 328.3 lb

## 2018-06-15 DIAGNOSIS — F320 Major depressive disorder, single episode, mild: Secondary | ICD-10-CM

## 2018-06-15 DIAGNOSIS — Z23 Encounter for immunization: Secondary | ICD-10-CM | POA: Diagnosis not present

## 2018-06-15 DIAGNOSIS — I10 Essential (primary) hypertension: Secondary | ICD-10-CM

## 2018-06-15 DIAGNOSIS — Z862 Personal history of diseases of the blood and blood-forming organs and certain disorders involving the immune mechanism: Secondary | ICD-10-CM

## 2018-06-15 DIAGNOSIS — E118 Type 2 diabetes mellitus with unspecified complications: Secondary | ICD-10-CM | POA: Diagnosis not present

## 2018-06-15 DIAGNOSIS — E785 Hyperlipidemia, unspecified: Secondary | ICD-10-CM

## 2018-06-15 DIAGNOSIS — E1140 Type 2 diabetes mellitus with diabetic neuropathy, unspecified: Secondary | ICD-10-CM

## 2018-06-15 DIAGNOSIS — Z79899 Other long term (current) drug therapy: Secondary | ICD-10-CM

## 2018-06-15 DIAGNOSIS — K219 Gastro-esophageal reflux disease without esophagitis: Secondary | ICD-10-CM

## 2018-06-15 DIAGNOSIS — E559 Vitamin D deficiency, unspecified: Secondary | ICD-10-CM

## 2018-06-15 DIAGNOSIS — Z794 Long term (current) use of insulin: Secondary | ICD-10-CM

## 2018-06-15 LAB — POCT GLYCOSYLATED HEMOGLOBIN (HGB A1C): Hemoglobin A1C: 6.1 % — AB (ref 4.0–5.6)

## 2018-06-15 MED ORDER — VENLAFAXINE HCL ER 75 MG PO CP24: ORAL_CAPSULE | ORAL | 1 refills | Status: DC

## 2018-06-15 MED ORDER — VENLAFAXINE HCL ER 150 MG PO CP24: ORAL_CAPSULE | ORAL | 1 refills | Status: DC

## 2018-06-15 MED ORDER — PANTOPRAZOLE SODIUM 40 MG PO TBEC: 40.0000 mg | DELAYED_RELEASE_TABLET | Freq: Every day | ORAL | 0 refills | Status: DC

## 2018-06-15 MED ORDER — METFORMIN HCL ER 750 MG PO TB24: 750.0000 mg | ORAL_TABLET | Freq: Every day | ORAL | 1 refills | Status: DC

## 2018-06-15 MED ORDER — HYDROCHLOROTHIAZIDE 12.5 MG PO TABS: 12.5000 mg | ORAL_TABLET | Freq: Every day | ORAL | 1 refills | Status: DC

## 2018-06-15 MED ORDER — ATORVASTATIN CALCIUM 40 MG PO TABS: 40.0000 mg | ORAL_TABLET | Freq: Every day | ORAL | 1 refills | Status: DC

## 2018-06-15 NOTE — Progress Notes (Signed)
Name: Jimmy Bryant   MRN: [id number]    DOB: December 03, 1970   Date:06/15/2018       Progress Note  Subjective  Chief Complaint  Chief Complaint  Patient presents with  . Medication Refill  . Diabetes  . Depression  . Hypertension  . Anemia  . Obesity  . Low back pain after a fall    HPI  DM with ED:He is still not  drinking sodas for breakfast, he is drinking diet tea. He deniespolydipsia , polyuria or polyphagia. He was on Trucility because of nausea and diarrhea, going to have bariatric surgery appointment 06/2018 He is back on Metformin at this time. HgbA1C was 6.2 went up to 6.8% down to 6.5% and today is 6.1% .Taking aspirin daily. Eye exam is up to date but we do not have a copy - we will request it today. Not on ED medication because of cost. Glucosenot being checked at home, no symptoms of hypoglycemia.   HTN: his bp has been controlled with HCTZ, no chest pain or palpitation, he states bp at home has been controlled also.  Hyperlipidemia: he is taking Atorvastatin and denies muscle pain, no chest pain,reviewed labs with patient.  Obesity:he still wants to have surgery, but postponed because of finance problems, he gained weight againsince last visit. He is working more out of town Eating fast food again Avoiding sweet beverage but likes fries   OSA: using CPAP machine every night, all night, he has one machine for the house and one for his truck. Feels more rested, he has also noticed that he can dream now. He denies headaches. Unchanged.  Major Depression:he is still taking Effexor since winter again, he states while on medication he has no angry spells, no crying spells, he is still has baseline fatigue, He denies hopelessness at this time. He states has episodes of sadness about once a week but resolves by itself.  He is compliant with medication.  History of back surgery, had a fall work back in winter 2019 saw his surgeon and no damage to hardware, back to  baseline    Patient Active Problem List   Diagnosis Date Noted  . Mild episode of recurrent major depressive disorder ([place]) 12/29/2015  . Diabetes mellitus type II, controlled ([place]) 04/30/2015  . Circadian rhythm sleep disorder, shift work type 04/23/2015  . Dyslipidemia 04/23/2015  . H/O lumbosacral spine surgery 04/23/2015  . H/O umbilical hernia repair 85/63/1497  . Dysmetabolic syndrome 02/63/7858  . Morbid (severe) obesity due to excess calories ([place]) 04/23/2015  . ED (erectile dysfunction) of organic origin 04/23/2015  . Obstructive apnea 04/23/2015  . Vitamin D deficiency 04/23/2015  . Decreased testosterone level 04/23/2015  . Gastro-esophageal reflux disease without esophagitis 08/22/2009    Past Surgical History:  Procedure Laterality Date  . microdecompression lumbar spine  02/2011   L4-5 and L5-S1 by Dr. Babette Relic  . UMBILICAL HERNIA REPAIR  07/10/2011    Family History  Problem Relation Age of Onset  . Diabetes Mother   . Hypertension Father   . CAD Father   . Hypertension Sister   . Hypertension Daughter   . ADD / ADHD Son     Social History   Socioeconomic History  . Marital status: Married    Spouse name: Claiborne Billings  . Number of children: 1  . Years of education: Not on file  . Highest education level: 8th grade  Occupational History  . Not on file  Social Needs  . Financial  resource strain: Somewhat hard  . Food insecurity:    Worry: Sometimes true    Inability: Sometimes true  . Transportation needs:    Medical: No    Non-medical: No  Tobacco Use  . Smoking status: Former Smoker    Packs/day: 1.00    Years: 31.00    Pack years: 31.00    Types: Cigarettes    Start date: 10/28/1980    Last attempt to quit: 06/28/2012    Years since quitting: 5.9  . Smokeless tobacco: Never Used  Substance and Sexual Activity  . Alcohol use: Yes    Alcohol/week: 0.0 standard drinks    Comment: rarely-a beer a month  . Drug use: No  . Sexual activity: Yes     Partners: Female  Lifestyle  . Physical activity:    Days per week: 5 days    Minutes per session: 60 min  . Stress: Not at all  Relationships  . Social connections:    Talks on phone: More than three times a week    Gets together: Once a week    Attends religious service: Never    Active member of club or organization: No    Attends meetings of clubs or organizations: Never    Relationship status: Married  . Intimate partner violence:    Fear of current or ex partner: No    Emotionally abused: No    Physically abused: No    Forced sexual activity: No  Other Topics Concern  . Not on file  Social History Narrative  . Not on file     Current Outpatient Medications:  .  aspirin 81 MG tablet, Take 1 tablet by mouth daily., Disp: , Rfl:  .  atorvastatin (LIPITOR) 40 MG tablet, Take 1 tablet (40 mg total) by mouth daily., Disp: 90 tablet, Rfl: 1 .  Cholecalciferol (VITAMIN D3) 2000 UNITS capsule, Take 1 capsule by mouth daily., Disp: , Rfl:  .  hydrochlorothiazide (HYDRODIURIL) 12.5 MG tablet, Take 1 tablet (12.5 mg total) by mouth daily., Disp: 90 tablet, Rfl: 1 .  ibuprofen (ADVIL,MOTRIN) 200 MG tablet, Take 2 tablets by mouth as needed., Disp: , Rfl:  .  metFORMIN (GLUCOPHAGE XR) 750 MG 24 hr tablet, Take 1 tablet (750 mg total) by mouth daily with breakfast., Disp: 180 tablet, Rfl: 1 .  pantoprazole (PROTONIX) 40 MG tablet, Take 1 tablet (40 mg total) by mouth daily., Disp: 90 tablet, Rfl: 0 .  venlafaxine XR (EFFEXOR-XR) 75 MG 24 hr capsule, TAKE 1 CAPSULE(75 MG) BY MOUTH DAILY WITH BREAKFAST, Disp: 90 capsule, Rfl: 1  Allergies  Allergen Reactions  . Trulicity [Dulaglutide] Nausea Only    And diarrhea     ROS  Constitutional: Negative for fever, positive for  weight change.  Respiratory: Negative for cough and shortness of breath.   Cardiovascular: Negative for chest pain or palpitations.  Gastrointestinal: Negative for abdominal pain, no bowel changes.   Musculoskeletal: Negative for gait problem or joint swelling.  Skin: Negative for rash.  Neurological: Negative for dizziness or headache.  No other specific complaints in a complete review of systems (except as listed in HPI above).  Objective  Vitals:   06/15/18 1001  BP: 118/64  Pulse: 75  Resp: 16  Temp: 98 F (36.7 C)  TempSrc: Oral  SpO2: 95%  Weight: (!) 328 lb 4.8 oz (148.9 kg)  Height: 5\' 10"  (1.778 m)    Body mass index is 47.11 kg/m.  Physical Exam  Constitutional: Patient  appears well-developed and well-nourished. Obese  No distress.  HEENT: head atraumatic, normocephalic, pupils equal and reactive to light,  neck supple, throat within normal limits Cardiovascular: Normal rate, regular rhythm and normal heart sounds.  No murmur heard. No BLE edema. Pulmonary/Chest: Effort normal and breath sounds normal. No respiratory distress. Abdominal: Soft.  There is no tenderness. Psychiatric: Patient has a normal mood and affect. behavior is normal. Judgment and thought content normal.  Recent Results (from the past 2160 hour(s))  POCT HgB A1C     Status: Abnormal   Collection Time: 06/15/18 10:07 AM  Result Value Ref Range   Hemoglobin A1C 6.1 (A) 4.0 - 5.6 %   HbA1c POC (<> result, manual entry)     HbA1c, POC (prediabetic range)     HbA1c, POC (controlled diabetic range)        PHQ2/9: Depression screen [facility] 2/9 06/15/2018 01/09/2018 01/09/2018 10/09/2017 06/20/2017  Decreased Interest 0 0 0 0 0  Down, Depressed, Hopeless 0 0 0 0 1  PHQ - 2 Score 0 0 0 0 1  Altered sleeping 0 1 - 1 3  Tired, decreased energy 1 2 - 1 2  Change in appetite 0 1 - 0 0  Feeling bad or failure about yourself  0 0 - 0 0  Trouble concentrating 0 0 - 0 0  Moving slowly or fidgety/restless 0 0 - 0 0  Suicidal thoughts 0 0 - 0 0  PHQ-9 Score 1 4 - 2 6  Difficult doing work/chores - - - Not difficult at all Not difficult at all     Fall Risk: Fall Risk  06/15/2018 01/09/2018  10/09/2017 09/12/2017 06/20/2017  Falls in the past year? Yes Yes Yes No Yes  Number falls in past yr: 1 1 1  - 2 or more  Injury with Fall? Yes Yes Yes - No  Comment - [place] truck trail[place] - - -  Follow up - - - - Falls evaluation completed     Functional Status Survey: Is the patient deaf or have difficulty hearing?: No Does the patient have difficulty seeing, even when wearing glasses/contacts?: No Does the patient have difficulty concentrating, remembering, or making decisions?: No Does the patient have difficulty walking or climbing stairs?: No Does the patient have difficulty dressing or bathing?: No Does the patient have difficulty doing errands alone such as visiting a doctor's office or shopping?: No    Assessment & Plan  1. Controlled type 2 diabetes mellitus with complication, with long-term current use of insulin (HCC)  - POCT HgB A1C - POCT UA - Microalbumin - metFORMIN (GLUCOPHAGE XR) 750 MG 24 hr tablet; Take 1 tablet (750 mg total) by mouth daily with breakfast.  Dispense: 180 tablet; Refill: 1  2. Mild major depression (HCC)  - venlafaxine XR (EFFEXOR-XR) 150  MG 24 hr capsule; TAKE 1 CAPSULE(150 MG) BY MOUTH DAILY WITH BREAKFAST  Dispense: 90 capsule; Refill: 1  3. Morbid (severe) obesity due to excess calories [facility])  Discussed with the patient the risk posed by an increased BMI. Discussed importance of portion control, calorie counting and at least 150 minutes of physical activity weekly. Avoid sweet beverages and drink more water. Eat at least 6 servings of fruit and vegetables daily  He is working out of town more often and has gained weight since last visit. Discussed healthy diet.   4. Hypertension, uncontrolled  - Comprehensive metabolic panel - hydrochlorothiazide (HYDRODIURIL) 12.5 MG tablet; Take 1 tablet (12.5  mg total) by mouth daily.  Dispense: 90 tablet; Refill: 1  5. Gastroesophageal reflux disease without esophagitis  - pantoprazole  (PROTONIX) 40 MG tablet; Take 1 tablet (40 mg total) by mouth daily.  Dispense: 90 tablet; Refill: 0  6. Dyslipidemia  - Lipid panel - atorvastatin (LIPITOR) 40 MG tablet; Take 1 tablet (40 mg total) by mouth daily.  Dispense: 90 tablet; Refill: 1  7. Vitamin D deficiency  - VITAMIN D 25 Hydroxy (Vit-D Deficiency, Fractures)  8. Long-term use of high-risk medication  - Vitamin B12  9. History of anemia  - CBC with Differential/Platelet  10. Controlled type 2 diabetes with neuropathy (HCC)  - metFORMIN (GLUCOPHAGE XR) 750 MG 24 hr tablet; Take 1 tablet (750 mg total) by mouth daily with breakfast.  Dispense: 180 tablet; Refill: 1  11. Needs flu shot  - Flu Vaccine QUAD 36+ mos IM

## 2018-06-17 LAB — COMPREHENSIVE METABOLIC PANEL
ALT: 43 IU/L (ref 0–44)
AST: 31 IU/L (ref 0–40)
Albumin/Globulin Ratio: 1.4 (ref 1.2–2.2)
Albumin: 4.2 g/dL (ref 3.5–5.5)
Alkaline Phosphatase: 80 IU/L (ref 39–117)
BUN/Creatinine Ratio: 16 (ref 9–20)
BUN: 15 mg/dL (ref 6–24)
Bilirubin Total: 0.4 mg/dL (ref 0.0–1.2)
CO2: 26 mmol/L (ref 20–29)
Calcium: 9.5 mg/dL (ref 8.7–10.2)
Chloride: 99 mmol/L (ref 96–106)
Creatinine, Ser: 0.94 mg/dL (ref 0.76–1.27)
GFR calc Af Amer: 110 mL/min/{1.73_m2} (ref >59)
GFR calc non Af Amer: 95 mL/min/{1.73_m2} (ref >59)
Globulin, Total: 3 g/dL (ref 1.5–4.5)
Glucose: 99 mg/dL (ref 65–99)
Potassium: 4.5 mmol/L (ref 3.5–5.2)
Sodium: 139 mmol/L (ref 134–144)
Total Protein: 7.2 g/dL (ref 6.0–8.5)

## 2018-06-17 LAB — VITAMIN B12: Vitamin B-12: 386 pg/mL (ref 232–1245)

## 2018-06-17 LAB — CBC WITH DIFFERENTIAL/PLATELET
Basophils Absolute: 0.1 10*3/uL (ref 0.0–0.2)
Basos: 1 %
EOS (ABSOLUTE): 0.2 10*3/uL (ref 0.0–0.4)
Eos: 2 %
Hematocrit: 40 % (ref 37.5–51.0)
Hemoglobin: 12.9 g/dL — ABNORMAL LOW (ref 13.0–17.7)
Immature Grans (Abs): 0 10*3/uL (ref 0.0–0.1)
Immature Granulocytes: 0 %
Lymphocytes Absolute: 2.9 10*3/uL (ref 0.7–3.1)
Lymphs: 34 %
MCH: 26.2 pg — ABNORMAL LOW (ref 26.6–33.0)
MCHC: 32.3 g/dL (ref 31.5–35.7)
MCV: 81 fL (ref 79–97)
Monocytes Absolute: 0.6 10*3/uL (ref 0.1–0.9)
Monocytes: 7 %
Neutrophils Absolute: 4.8 10*3/uL (ref 1.4–7.0)
Neutrophils: 56 %
Platelets: 288 10*3/uL (ref 150–450)
RBC: 4.92 x10E6/uL (ref 4.14–5.80)
RDW: 14.9 % (ref 12.3–15.4)
WBC: 8.5 10*3/uL (ref 3.4–10.8)

## 2018-06-17 LAB — LIPID PANEL
Chol/HDL Ratio: 5.1 ratio — ABNORMAL HIGH (ref 0.0–5.0)
Cholesterol, Total: 137 mg/dL (ref 100–199)
HDL: 27 mg/dL — ABNORMAL LOW (ref >39)
LDL Calculated: 83 mg/dL (ref 0–99)
Triglycerides: 137 mg/dL (ref 0–149)
VLDL Cholesterol Cal: 27 mg/dL (ref 5–40)

## 2018-06-17 LAB — VITAMIN D 25 HYDROXY (VIT D DEFICIENCY, FRACTURES): Vit D, 25-Hydroxy: 40.2 ng/mL (ref 30.0–100.0)

## 2018-06-19 LAB — POCT UA - MICROALBUMIN: Microalbumin Ur, POC: NEGATIVE mg/L

## 2018-06-22 ENCOUNTER — Other Ambulatory Visit: Payer: Self-pay

## 2018-06-22 DIAGNOSIS — Z862 Personal history of diseases of the blood and blood-forming organs and certain disorders involving the immune mechanism: Secondary | ICD-10-CM

## 2018-07-01 LAB — IRON AND TIBC
Iron Saturation: 18 % (ref 15–55)
Iron: 60 ug/dL (ref 38–169)
Total Iron Binding Capacity: 332 ug/dL (ref 250–450)
UIBC: 272 ug/dL (ref 111–343)

## 2018-07-01 LAB — FERRITIN: Ferritin: 174 ng/mL (ref 30–400)

## 2018-07-01 LAB — SPECIMEN STATUS REPORT

## 2018-07-07 ENCOUNTER — Encounter: Payer: Self-pay | Admitting: Family Medicine

## 2018-09-14 ENCOUNTER — Encounter: Payer: Self-pay | Admitting: Family Medicine

## 2018-09-14 ENCOUNTER — Ambulatory Visit: Payer: Managed Care, Other (non HMO) | Admitting: Family Medicine

## 2018-09-14 VITALS — BP 122/76 | HR 90 | Temp 98.5°F | Resp 16 | Ht 70.0 in | Wt 317.0 lb

## 2018-09-14 DIAGNOSIS — K219 Gastro-esophageal reflux disease without esophagitis: Secondary | ICD-10-CM | POA: Diagnosis not present

## 2018-09-14 DIAGNOSIS — R197 Diarrhea, unspecified: Secondary | ICD-10-CM

## 2018-09-14 DIAGNOSIS — F320 Major depressive disorder, single episode, mild: Secondary | ICD-10-CM

## 2018-09-14 DIAGNOSIS — E1169 Type 2 diabetes mellitus with other specified complication: Secondary | ICD-10-CM

## 2018-09-14 DIAGNOSIS — I10 Essential (primary) hypertension: Secondary | ICD-10-CM

## 2018-09-14 DIAGNOSIS — E785 Hyperlipidemia, unspecified: Secondary | ICD-10-CM | POA: Diagnosis not present

## 2018-09-14 DIAGNOSIS — R1032 Left lower quadrant pain: Secondary | ICD-10-CM

## 2018-09-14 LAB — POCT GLYCOSYLATED HEMOGLOBIN (HGB A1C): Hemoglobin A1C: 6 % — AB (ref 4.0–5.6)

## 2018-09-14 LAB — HEMOGLOBIN A1C: Hemoglobin A1C: 5.9 % — AB (ref 4.0–5.6)

## 2018-09-14 LAB — LIPID PANEL
Cholesterol: 105 (ref 0–200)
HDL: 25 — AB (ref 35–70)
LDL Cholesterol: 54
Triglycerides: 129 (ref 40–160)

## 2018-09-14 LAB — BASIC METABOLIC PANEL
BUN: 17 (ref 4–21)
Creatinine: 1.1
Glucose: 116
Potassium: 4.1 (ref 3.4–5.3)
Sodium: 137 (ref 137–147)

## 2018-09-14 LAB — CBC AND DIFFERENTIAL
HCT: 41 (ref 39–52)
Hemoglobin: 13.5 (ref 13.0–17.0)
Platelets: 323 (ref 150–399)
WBC: 5.8

## 2018-09-14 LAB — VITAMIN D 25 HYDROXY (VIT D DEFICIENCY, FRACTURES): Vit D, 25-Hydroxy: 38.2

## 2018-09-14 LAB — TSH: TSH: 2.21 (ref 0.41–5.90)

## 2018-09-14 NOTE — Progress Notes (Signed)
Name: RAWLINS STUARD   MRN: [id number]    DOB: [birth year]   Date:09/14/2018       Progress Note  Subjective  Chief Complaint  Chief Complaint  Patient presents with  . Diabetes  . Hyperlipidemia  . Abdominal Pain    HPI  LLQ pain: he states symptoms started 3 days ago, initially had LLQ pain, followed by nausea, vomiting and diarrhea, lack of appetite but no fever but had chills. He states he had two watery bowel movements yesterday, no blood or mucus. Pain on LLQ was initially sharp and continues but yesterday it become dull and today only pain when he gets up from sitting and during exam in our office. Discussed possible diagnosis of diverticulitis, he is doing much better clinically and we will hold off on starting antibiotics at this time, he will fall a bland diet but if WBC very high we will call in rx to his pharmacy  Morbid obesity: he has tried multiple diets in the past, most recently Weight Watchers but only lost about 10 lbs. He has also tried low carbohydrate diet , he is now trying to increase protein intake for breakfast, packing his lunch and eating dinner at home and smaller portions, and when out of town for work he still eats out but making better choices - more salads and avoiding the burgers. He is going to [place] [facility].   Major Depression: on higher doses of medication and is doing well at this time, denies suicidal thoughts or ideation   DM with ED: he stopped drinking sodas for breakfast, he is drinking diet tea. He deniespolydipsia , polyuria or polyphagia. He was on Trucility because of nausea and diarrhea, going to have bariatric surgery appointment 06/2018 He is back on Metformin at this time. HgbA1Cwas6.2went up to6.8%down to 6.5% , 6.1%, today is 6.Marland Kitchen0% Taking aspirin daily. Eye exam is up to date . Not on ED medication because of cost. Glucosenot being checked at home, no symptoms of hypoglycemia.   HTN: his bphas been controlled with HCTZ,  no chest pain or palpitation, he states bp at home has been controlled also. We will continue medication for now  Hyperlipidemia: he is taking Atorvastatin and denies muscle pain, no chest pain. Unchanged   OSA: using CPAP machine every night, all night, he has one machine for the house and one for his truck. Feels more rested, he has also noticed that he can dream now. He denies morning headaches.   Patient Active Problem List   Diagnosis Date Noted  . Morbid obesity with BMI of 45.0-49.9, adult ([place]) 08/01/2018  . Mild episode of recurrent major depressive disorder ([place]) 12/29/2015  . Diabetes mellitus type II, controlled ([place]) 04/30/2015  . Circadian rhythm sleep disorder, shift work type 04/23/2015  . Dyslipidemia 04/23/2015  . H/O lumbosacral spine surgery 04/23/2015  . H/O umbilical hernia repair 08/67/6195  . Dysmetabolic syndrome 09/32/6712  . Morbid (severe) obesity due to excess calories ([place]) 04/23/2015  . ED (erectile dysfunction) of organic origin 04/23/2015  . Obstructive apnea 04/23/2015  . Vitamin D deficiency 04/23/2015  . Decreased testosterone level 04/23/2015  . Gastro-esophageal reflux disease without esophagitis 08/22/2009    Past Surgical History:  Procedure Laterality Date  . microdecompression lumbar spine  02/2011   L4-5 and L5-S1 by Dr. Babette Relic  . UMBILICAL HERNIA REPAIR  07/10/2011    Family History  Problem Relation Age of Onset  . Diabetes Mother   . Hypertension Father   .  CAD Father   . Hypertension Sister   . Hypertension Daughter   . ADD / ADHD Son     Social History   Socioeconomic History  . Marital status: Married    Spouse name: Claiborne Billings  . Number of children: 1  . Years of education: Not on file  . Highest education level: 8th grade  Occupational History  . Not on file  Social Needs  . Financial resource strain: Somewhat hard  . Food insecurity:    Worry: Sometimes true    Inability: Sometimes true  . Transportation needs:     Medical: No    Non-medical: No  Tobacco Use  . Smoking status: Former Smoker    Packs/day: 1.00    Years: 31.00    Pack years: 31.00    Types: Cigarettes    Start date: 10/28/1980    Last attempt to quit: 06/28/2012    Years since quitting: 6.2  . Smokeless tobacco: Never Used  Substance and Sexual Activity  . Alcohol use: Yes    Alcohol/week: 0.0 standard drinks    Comment: rarely-a beer a month  . Drug use: No  . Sexual activity: Yes    Partners: Female  Lifestyle  . Physical activity:    Days per week: 5 days    Minutes per session: 60 min  . Stress: Not at all  Relationships  . Social connections:    Talks on phone: More than three times a week    Gets together: Once a week    Attends religious service: Never    Active member of club or organization: No    Attends meetings of clubs or organizations: Never    Relationship status: Married  . Intimate partner violence:    Fear of current or ex partner: No    Emotionally abused: No    Physically abused: No    Forced sexual activity: No  Other Topics Concern  . Not on file  Social History Narrative  . Not on file     Current Outpatient Medications:  .  aspirin 81 MG tablet, Take 1 tablet by mouth daily., Disp: , Rfl:  .  atorvastatin (LIPITOR) 40 MG tablet, Take 1 tablet (40 mg total) by mouth daily., Disp: 90 tablet, Rfl: 1 .  Cholecalciferol (VITAMIN D3) 2000 UNITS capsule, Take 1 capsule by mouth daily., Disp: , Rfl:  .  hydrochlorothiazide (HYDRODIURIL) 12.5 MG tablet, Take 1 tablet (12.5 mg total) by mouth daily., Disp: 90 tablet, Rfl: 1 .  ibuprofen (ADVIL,MOTRIN) 200 MG tablet, Take 2 tablets by mouth as needed., Disp: , Rfl:  .  metFORMIN (GLUCOPHAGE XR) 750 MG 24 hr tablet, Take 1 tablet (750 mg total) by mouth daily with breakfast., Disp: 180 tablet, Rfl: 1 .  pantoprazole (PROTONIX) 40 MG tablet, Take 1 tablet (40 mg total) by mouth daily., Disp: 90 tablet, Rfl: 0 .  venlafaxine XR (EFFEXOR-XR) 150 MG 24  hr capsule, TAKE 1 CAPSULE(75 MG) BY MOUTH DAILY WITH BREAKFAST, Disp: 90 capsule, Rfl: 1  Allergies  Allergen Reactions  . Oxycodone-Acetaminophen Itching  . Trulicity [Dulaglutide] Nausea Only    And diarrhea    I personally reviewed active problem list, medication list, allergies, family history, social history with the patient/caregiver today.   ROS  Constitutional: Negative for fever or weight change.  Respiratory: Negative for cough and shortness of breath.   Cardiovascular: Negative for chest pain or palpitations.  Gastrointestinal: Positive  for abdominal pain and  bowel changes.  Musculoskeletal: Negative for gait problem or joint swelling.  Skin: Negative for rash.  Neurological: Negative for dizziness or headache.  No other specific complaints in a complete review of systems (except as listed in HPI above).   Objective  Vitals:   09/14/18 1106  BP: 122/76  Pulse: 90  Resp: 16  Temp: 98.5 F (36.9 C)  TempSrc: Oral  SpO2: 98%  Weight: (!) 317 lb (143.8 kg)  Height: 5\' 10"  (1.778 m)    Body mass index is 45.48 kg/m.  Physical Exam  Constitutional: Patient appears well-developed and well-nourished. Obese No distress.  HEENT: head atraumatic, normocephalic, pupils equal and reactive to light, neck supple, throat within normal limits Cardiovascular: Normal rate, regular rhythm and normal heart sounds.  No murmur heard. No BLE edema. Pulmonary/Chest: Effort normal and breath sounds normal. No respiratory distress. Abdominal: Soft.  There is LLQ  Tenderness, negative for rebound tenderness. Psychiatric: Patient has a normal mood and affect. behavior is normal. Judgment and thought content normal.  Recent Results (from the past 2160 hour(s))  POCT UA - Microalbumin     Status: Normal   Collection Time: 06/19/18 10:49 AM  Result Value Ref Range   Microalbumin Ur, POC negative mg/L   Creatinine, POC     Albumin/Creatinine Ratio, Urine, POC    POCT  glycosylated hemoglobin (Hb A1C)     Status: Abnormal   Collection Time: 09/14/18 11:17 AM  Result Value Ref Range   Hemoglobin A1C 6.0 (A) 4.0 - 5.6 %   HbA1c POC (<> result, manual entry)     HbA1c, POC (prediabetic range)     HbA1c, POC (controlled diabetic range)      Diabetic Foot Exam: Diabetic Foot Exam - Simple   Simple Foot Form Diabetic Foot exam was performed with the following findings:  Yes 09/14/2018 11:10 AM  Visual Inspection See comments:  Yes Sensation Testing Intact to touch and monofilament testing bilaterally:  Yes Pulse Check Posterior Tibialis and Dorsalis pulse intact bilaterally:  Yes Comments Callus formation on big toes      PHQ2/9: Depression screen [facility] 2/9 09/14/2018 06/15/2018 01/09/2018 01/09/2018 10/09/2017  Decreased Interest - 0 0 0 0  Down, Depressed, Hopeless - 0 0 0 0  PHQ - 2 Score - 0 0 0 0  Altered sleeping - 0 1 - 1  Tired, decreased energy - 1 2 - 1  Change in appetite - 0 1 - 0  Feeling bad or failure about yourself  - 0 0 - 0  Trouble concentrating - 0 0 - 0  Moving slowly or fidgety/restless - 0 0 - 0  Suicidal thoughts - 0 0 - 0  PHQ-9 Score - 1 4 - 2  Difficult doing work/chores Not difficult at all - - - Not difficult at all     Fall Risk: Fall Risk  06/15/2018 01/09/2018 10/09/2017 09/12/2017 06/20/2017  Falls in the past year? Yes Yes Yes No Yes  Number falls in past yr: 1 1 1  - 2 or more  Injury with Fall? Yes Yes Yes - No  Comment - [place] off of a truck trailor - - -  Follow up - - - - Falls evaluation completed      Assessment & Plan  1. Controlled type 2 diabetes mellitus with dyslipidemia ([place])   2. Morbid (severe) obesity due to excess calories [facility])  He is getting evaluated by bariatric center at [facility], he will be a great candidate based on his co-morbid  conditions.   3. Hypertension benign   At goal  4. Gastroesophageal reflux disease without esophagitis  Controlled  5. Mild major depression  ([place])  Doing well on higher dose of medication   6. Dyslipidemia  On statin therapy   7. Left lower quadrant abdominal pain  He is doing better now, no bowel movements today, explained it may be diverticulitis, he went to [place] this am for Bariatric center we will see if we can add CBC. Consider antibiotics is WBC high, monitor and return if symptoms gets worse, fever or return of diarrhea.  - CBC with Differential/Platelet  8. Diarrhea, unspecified type  - CBC with Differential/Platelet

## 2018-09-14 NOTE — Patient Instructions (Signed)
Diverticulitis °Diverticulitis is infection or inflammation of small pouches (diverticula) in the colon that form due to a condition called diverticulosis. Diverticula can trap stool (feces) and bacteria, causing infection and inflammation. °Diverticulitis may cause severe stomach pain and diarrhea. It may lead to tissue damage in the colon that causes bleeding. The diverticula may also burst (rupture) and cause infected stool to enter other areas of the abdomen. °Complications of diverticulitis can include: °· Bleeding. °· Severe infection. °· Severe pain. °· Rupture (perforation) of the colon. °· Blockage (obstruction) of the colon. ° °What are the causes? °This condition is caused by stool becoming trapped in the diverticula, which allows bacteria to grow in the diverticula. This leads to inflammation and infection. °What increases the risk? °You are more likely to develop this condition if: °· You have diverticulosis. The risk for diverticulosis increases if: °? You are overweight or obese. °? You use tobacco products. °? You do not get enough exercise. °· You eat a diet that does not include enough fiber. High-fiber foods include fruits, vegetables, beans, nuts, and whole grains. ° °What are the signs or symptoms? °Symptoms of this condition may include: °· Pain and tenderness in the abdomen. The pain is normally located on the left side of the abdomen, but it may occur in other areas. °· Fever and chills. °· Bloating. °· Cramping. °· Nausea. °· Vomiting. °· Changes in bowel routines. °· Blood in your stool. ° °How is this diagnosed? °This condition is diagnosed based on: °· Your medical history. °· A physical exam. °· Tests to make sure there is nothing else causing your condition. These tests may include: °? Blood tests. °? Urine tests. °? Imaging tests of the abdomen, including X-rays, ultrasounds, MRIs, or CT scans. ° °How is this treated? °Most cases of this condition are mild and can be treated at home.  Treatment may include: °· Taking over-the-counter pain medicines. °· Following a clear liquid diet. °· Taking antibiotic medicines by mouth. °· Rest. ° °More severe cases may need to be treated at a hospital. Treatment may include: °· Not eating or drinking. °· Taking prescription pain medicine. °· Receiving antibiotic medicines through an IV tube. °· Receiving fluids and nutrition through an IV tube. °· Surgery. ° °When your condition is under control, your health care provider may recommend that you have a colonoscopy. This is an exam to look at the entire large intestine. During the exam, a lubricated, bendable tube is inserted into the anus and then passed into the rectum, colon, and other parts of the large intestine. A colonoscopy can show how severe your diverticula are and whether something else may be causing your symptoms. °Follow these instructions at home: °Medicines °· Take over-the-counter and prescription medicines only as told by your health care provider. These include fiber supplements, probiotics, and stool softeners. °· If you were prescribed an antibiotic medicine, take it as told by your health care provider. Do not stop taking the antibiotic even if you start to feel better. °· Do not drive or use heavy machinery while taking prescription pain medicine. °General instructions °· Follow a full liquid diet or another diet as directed by your health care provider. After your symptoms improve, your health care provider may tell you to change your diet. He or she may recommend that you eat a diet that contains at least 25 g (25 grams) of fiber daily. Fiber makes it easier to pass stool. Healthy sources of fiber include: °? Berries. One cup   contains 4-8 grams of fiber. °? Beans or lentils. One half cup contains 5-8 grams of fiber. °? Green vegetables. One cup contains 4 grams of fiber. °· Exercise for at least 30 minutes, 3 times each week. You should exercise hard enough to raise your heart rate and  break a sweat. °· Keep all follow-up visits as told by your health care provider. This is important. You may need a colonoscopy. °Contact a health care provider if: °· Your pain does not improve. °· You have a hard time drinking or eating food. °· Your bowel movements do not return to normal. °Get help right away if: °· Your pain gets worse. °· Your symptoms do not get better with treatment. °· Your symptoms suddenly get worse. °· You have a fever. °· You vomit more than one time. °· You have stools that are bloody, black, or tarry. °Summary °· Diverticulitis is infection or inflammation of small pouches (diverticula) in the colon that form due to a condition called diverticulosis. Diverticula can trap stool (feces) and bacteria, causing infection and inflammation. °· You are at higher risk for this condition if you have diverticulosis and you eat a diet that does not include enough fiber. °· Most cases of this condition are mild and can be treated at home. More severe cases may need to be treated at a hospital. °· When your condition is under control, your health care provider may recommend that you have an exam called a colonoscopy. This exam can show how severe your diverticula are and whether something else may be causing your symptoms. °This information is not intended to replace advice given to you by your health care provider. Make sure you discuss any questions you have with your health care provider. °Document Released: 07/24/2005 Document Revised: 11/16/2016 Document Reviewed: 11/16/2016 °Elsevier Interactive Patient Education © 2018 Elsevier Inc. ° °

## 2018-09-17 ENCOUNTER — Encounter: Payer: Self-pay | Admitting: Family Medicine

## 2018-09-21 ENCOUNTER — Encounter: Payer: Self-pay | Admitting: Family Medicine

## 2018-10-29 ENCOUNTER — Encounter: Payer: Self-pay | Admitting: Family Medicine

## 2018-10-29 ENCOUNTER — Ambulatory Visit: Payer: Managed Care, Other (non HMO) | Admitting: Family Medicine

## 2018-10-29 VITALS — BP 150/76 | HR 110 | Temp 98.1°F | Resp 16 | Ht 70.0 in | Wt 311.6 lb

## 2018-10-29 DIAGNOSIS — Z794 Long term (current) use of insulin: Secondary | ICD-10-CM | POA: Diagnosis not present

## 2018-10-29 DIAGNOSIS — E118 Type 2 diabetes mellitus with unspecified complications: Secondary | ICD-10-CM | POA: Diagnosis not present

## 2018-10-29 DIAGNOSIS — J36 Peritonsillar abscess: Secondary | ICD-10-CM

## 2018-10-29 NOTE — Patient Instructions (Signed)
Peritonsillar Abscess A peritonsillar abscess is an infected area in your throat that is filled with pus. It forms behind your tonsils. This may be treated by:  Draining the pus. Your doctor may do this with a syringe and a needle (needle aspiration) or by making a cut in the abscess.  Using antibiotic medicine. Follow these instructions at home: Medicines  Take over-the-counter and prescription medicines only as told by your doctor.  If you were prescribed an antibiotic, take it as told by your doctor. Do not stop taking the antibiotic even if you start to feel better. Eating and drinking   Drink enough fluid to keep your pee (urine) pale yellow.  While your throat is sore, try one of these: ? Only drinking liquids. ? Eating only soft foods, such as yogurt and ice cream. General instructions  Rest as much as you can. Get plenty of sleep.  Return to your normal activities as told by your doctor. Ask your doctor what activities are safe for you.  If your abscess was drained, gargle with a salt-water mixture 3-4 times a day or as needed. ? To make a salt-water mixture, completely dissolve -1 tsp of salt in 1 cup of warm water. ? Do not swallow this mixture.  Do not use any products that have nicotine or tobacco in them. These include cigarettes and e-cigarettes. If you need help quitting, ask your doctor.  Keep all follow-up visits as told by your doctor. This is important. Contact a doctor if you have:  More pain, swelling, redness, or pus in your throat.  A headache.  Low energy (lethargy).  A general feeling of illness (malaise).  A fever.  Dizziness.  Trouble swallowing.  Trouble eating.  Signs of body fluid loss (dehydration), such as: ? Feeling light-headed when you are standing. ? Peeing (urinating) less than usual. ? A fast heart rate. ? Dry mouth. Get help right away if you:  Have trouble talking.  Have trouble breathing.  Breathing is easier when  you lean forward.  Cough up blood.  Throw up (vomit) blood.  Have very bad throat pain and it does not get better with medicine. Summary  A peritonsillar abscess is an infected area in your throat that is filled with pus.  You may be treated by having the abscess drained and by taking antibiotic medicine.  Contact a doctor if you have trouble swallowing or eating.  Get help right away if you cough up blood or see blood when you throw up (vomit). This information is not intended to replace advice given to you by your health care provider. Make sure you discuss any questions you have with your health care provider. Document Released: 10/02/2009 Document Revised: 08/26/2017 Document Reviewed: 08/26/2017 Elsevier Interactive Patient Education  2019 [employer].

## 2018-10-29 NOTE — Progress Notes (Signed)
Name: Jimmy Bryant   MRN: [id number]    DOB: 10/07/1971   Date:10/29/2018       Progress Note  Subjective  Chief Complaint  Chief Complaint  Patient presents with  . Sore Throat    x 4 days gradually worsening. Pain is sharp and hurts to swallow.  . Chills    HPI  Pt presents with 2 days of severe throat pain, 4 days total of discomfort.  He is having trouble swallowing his own saliva, able to take sips of water throughout the day, but it is very painful. Endorses subjective fevers and chills, also having body aches.  He denies nasal congestion, chest pain, shortness of breath.  Pt does have T2DM which has been well controlled.  Discussed sick day care with him.  Patient Active Problem List   Diagnosis Date Noted  . Morbid obesity with BMI of 45.0-49.9, adult ([place]) 08/01/2018  . Mild episode of recurrent major depressive disorder ([place]) 12/29/2015  . Diabetes mellitus type II, controlled ([place]) 04/30/2015  . Circadian rhythm sleep disorder, shift work type 04/23/2015  . Dyslipidemia 04/23/2015  . H/O lumbosacral spine surgery 04/23/2015  . H/O umbilical hernia repair 32/67/1245  . Dysmetabolic syndrome 80/99/8338  . Morbid (severe) obesity due to excess calories ([place]) 04/23/2015  . ED (erectile dysfunction) of organic origin 04/23/2015  . Obstructive apnea 04/23/2015  . Vitamin D deficiency 04/23/2015  . Decreased testosterone level 04/23/2015  . Gastro-esophageal reflux disease without esophagitis 08/22/2009    Social History   Tobacco Use  . Smoking status: Former Smoker    Packs/day: 1.00    Years: 31.00    Pack years: 31.00    Types: Cigarettes    Start date: 10/28/1980    Last attempt to quit: 06/28/2012    Years since quitting: 6.3  . Smokeless tobacco: Never Used  Substance Use Topics  . Alcohol use: Yes    Alcohol/week: 0.0 standard drinks    Comment: rarely-a beer a month     Current Outpatient Medications:  .  aspirin 81 MG tablet, Take 1 tablet by  mouth daily., Disp: , Rfl:  .  atorvastatin (LIPITOR) 40 MG tablet, Take 1 tablet (40 mg total) by mouth daily., Disp: 90 tablet, Rfl: 1 .  Cholecalciferol (VITAMIN D3) 2000 UNITS capsule, Take 1 capsule by mouth daily., Disp: , Rfl:  .  hydrochlorothiazide (HYDRODIURIL) 12.5 MG tablet, Take 1 tablet (12.5 mg total) by mouth daily., Disp: 90 tablet, Rfl: 1 .  ibuprofen (ADVIL,MOTRIN) 200 MG tablet, Take 2 tablets by mouth as needed., Disp: , Rfl:  .  metFORMIN (GLUCOPHAGE XR) 750 MG 24 hr tablet, Take 1 tablet (750 mg total) by mouth daily with breakfast., Disp: 180 tablet, Rfl: 1 .  pantoprazole (PROTONIX) 40 MG tablet, Take 1 tablet (40 mg total) by mouth daily., Disp: 90 tablet, Rfl: 0 .  venlafaxine XR (EFFEXOR-XR) 150 MG 24 hr capsule, TAKE 1 CAPSULE(75 MG) BY MOUTH DAILY WITH BREAKFAST, Disp: 90 capsule, Rfl: 1  Allergies  Allergen Reactions  . Oxycodone-Acetaminophen Itching  . Trulicity [Dulaglutide] Nausea Only    And diarrhea    I personally reviewed active problem list, medication list, allergies, notes from last encounter, lab results with the patient/caregiver today.  ROS  Ten systems reviewed and is negative except as mentioned in HPI.  Objective  Vitals:   10/29/18 1249 10/29/18 1250  BP:  (!) 150/76  Pulse:  (!) 110  Resp:  16  Temp:  98.1 F (36.7 C)  TempSrc:  Oral  SpO2:  99%  Weight:  (!) 311 lb 9.6 oz (141.3 kg)  Height: 5\' 10"  (1.778 m) 5\' 10"  (1.778 m)   Body mass index is 44.71 kg/m.  Nursing Note and Vital Signs reviewed.  Physical Exam  Constitutional: Patient appears well-developed and well-nourished. Obese. No distress.  HEENT: head atraumatic, normocephalic, pupils equal and reactive to light, Bilateral TM's without erythema or effusion,  bilateral maxillary and frontal sinuses are non-tender, neck supple with right sided lymphadenopathy that is tender to palpation, Peritonsillar abscess is present to the RIGHT side of the upper soft palate,  airway is intact.  Voice quality is slightly muffled. Cardiovascular: Normal rate, regular rhythm and normal heart sounds.  No murmur heard. No BLE edema. Pulmonary/Chest: Effort normal and breath sounds clear bilaterally. No respiratory distress. Abdominal: Soft, bowel sounds normal, there is no tenderness, no HSM Psychiatric: Patient has a normal mood and affect. behavior is normal. Judgment and thought content normal.  No results found for this or any previous visit (from the past 72 hour(s)).  Assessment & Plan  1. Peritonsillar abscess - Ambulatory referral to ENT - STAT referral is placed.  If unable to get him in today, we will send to ER for evaluation.  2. Controlled type 2 diabetes mellitus with complication, with long-term current use of insulin ([place]) - Sick day management discussed in detail; he has historically been well controlled.   -Red flags and when to present for emergency care or RTC including fever >101.2F, chest pain, shortness of breath, new/worsening/un-resolving symptoms, difficulty breathing, drooling reviewed with patient at time of visit. Follow up and care instructions discussed and provided in AVS.

## 2018-11-15 ENCOUNTER — Other Ambulatory Visit: Payer: Self-pay | Admitting: Family Medicine

## 2018-11-15 DIAGNOSIS — K219 Gastro-esophageal reflux disease without esophagitis: Secondary | ICD-10-CM

## 2018-11-16 NOTE — Telephone Encounter (Signed)
Refill request for general medication. Protonix   Last office visit 09/14/2018   Follow up  12/18/2017

## 2018-12-17 ENCOUNTER — Encounter: Payer: Self-pay | Admitting: Family Medicine

## 2018-12-17 ENCOUNTER — Ambulatory Visit: Payer: Managed Care, Other (non HMO) | Admitting: Family Medicine

## 2018-12-17 VITALS — BP 130/84 | HR 79 | Temp 97.8°F | Resp 16 | Ht 69.5 in | Wt 279.2 lb

## 2018-12-17 DIAGNOSIS — Z114 Encounter for screening for human immunodeficiency virus [HIV]: Secondary | ICD-10-CM | POA: Diagnosis not present

## 2018-12-17 DIAGNOSIS — Z0000 Encounter for general adult medical examination without abnormal findings: Secondary | ICD-10-CM

## 2018-12-17 DIAGNOSIS — F320 Major depressive disorder, single episode, mild: Secondary | ICD-10-CM

## 2018-12-17 DIAGNOSIS — E785 Hyperlipidemia, unspecified: Secondary | ICD-10-CM

## 2018-12-17 DIAGNOSIS — E1169 Type 2 diabetes mellitus with other specified complication: Secondary | ICD-10-CM

## 2018-12-17 DIAGNOSIS — Z9884 Bariatric surgery status: Secondary | ICD-10-CM

## 2018-12-17 DIAGNOSIS — K219 Gastro-esophageal reflux disease without esophagitis: Secondary | ICD-10-CM

## 2018-12-17 MED ORDER — VENLAFAXINE HCL ER 150 MG PO CP24: ORAL_CAPSULE | ORAL | 1 refills | Status: DC

## 2018-12-17 MED ORDER — ATORVASTATIN CALCIUM 40 MG PO TABS: 40.0000 mg | ORAL_TABLET | Freq: Every day | ORAL | 1 refills | Status: DC

## 2018-12-17 MED ORDER — PANTOPRAZOLE SODIUM 40 MG PO TBEC: 40.0000 mg | DELAYED_RELEASE_TABLET | Freq: Every day | ORAL | 0 refills | Status: DC

## 2018-12-17 NOTE — Patient Instructions (Signed)
Preventive Care 40-64 Years, Male Preventive care refers to lifestyle choices and visits with your health care provider that can promote health and wellness. What does preventive care include?   A yearly physical exam. This is also called an annual well check.  Dental exams once or twice a year.  Routine eye exams. Ask your health care provider how often you should have your eyes checked.  Personal lifestyle choices, including: ? Daily care of your teeth and gums. ? Regular physical activity. ? Eating a healthy diet. ? Avoiding tobacco and drug use. ? Limiting alcohol use. ? Practicing safe sex. ? Taking low-dose aspirin daily starting at age 50. ? Taking vitamin and mineral supplements as recommended by your health care provider. What happens during an annual well check? The services and screenings done by your health care provider during your annual well check will depend on your age, overall health, lifestyle risk factors, and family history of disease. Counseling Your health care provider may ask you questions about your:  Alcohol use.  Tobacco use.  Drug use.  Emotional well-being.  Home and relationship well-being.  Sexual activity.  Eating habits.  Work and work environment.  Method of birth control.  Menstrual cycle.  Pregnancy history. Screening You may have the following tests or measurements:  Height, weight, and BMI.  Blood pressure.  Lipid and cholesterol levels. These may be checked every 5 years, or more frequently if you are over 50 years old.  Skin check.  Lung cancer screening. You may have this screening every year starting at age 55 if you have a 30-pack-year history of smoking and currently smoke or have quit within the past 15 years.  Colorectal cancer screening. All adults should have this screening starting at age 50 and continuing until age 75. Your health care provider may recommend screening at age 45. You will have tests every  1-10 years, depending on your results and the type of screening test. People at increased risk should start screening at an earlier age. Screening tests may include: ? Guaiac-based fecal occult blood testing. ? Fecal immunochemical test (FIT). ? Stool DNA test. ? Virtual colonoscopy. ? Sigmoidoscopy. During this test, a flexible tube with a tiny camera (sigmoidoscope) is used to examine your rectum and lower colon. The sigmoidoscope is inserted through your anus into your rectum and lower colon. ? Colonoscopy. During this test, a long, thin, flexible tube with a tiny camera (colonoscope) is used to examine your entire colon and rectum.  Hepatitis C blood test.  Hepatitis B blood test.  Sexually transmitted disease (STD) testing.  Diabetes screening. This is done by checking your blood sugar (glucose) after you have not eaten for a while (fasting). You may have this done every 1-3 years.  Mammogram. This may be done every 1-2 years. Talk to your health care provider about when you should start having regular mammograms. This may depend on whether you have a family history of breast cancer.  BRCA-related cancer screening. This may be done if you have a family history of breast, ovarian, tubal, or peritoneal cancers.  Pelvic exam and Pap test. This may be done every 3 years starting at age 21. Starting at age 30, this may be done every 5 years if you have a Pap test in combination with an HPV test.  Bone density scan. This is done to screen for osteoporosis. You may have this scan if you are at high risk for osteoporosis. Discuss your test results, treatment options,   and if necessary, the need for more tests with your health care provider. Vaccines Your health care provider may recommend certain vaccines, such as:  Influenza vaccine. This is recommended every year.  Tetanus, diphtheria, and acellular pertussis (Tdap, Td) vaccine. You may need a Td booster every 10 years.  Varicella  vaccine. You may need this if you have not been vaccinated.  Zoster vaccine. You may need this after age 38.  Measles, mumps, and rubella (MMR) vaccine. You may need at least one dose of MMR if you were born in 1957 or later. You may also need a second dose.  Pneumococcal 13-valent conjugate (PCV13) vaccine. You may need this if you have certain conditions and were not previously vaccinated.  Pneumococcal polysaccharide (PPSV23) vaccine. You may need one or two doses if you smoke cigarettes or if you have certain conditions.  Meningococcal vaccine. You may need this if you have certain conditions.  Hepatitis A vaccine. You may need this if you have certain conditions or if you travel or work in places where you may be exposed to hepatitis A.  Hepatitis B vaccine. You may need this if you have certain conditions or if you travel or work in places where you may be exposed to hepatitis B.  Haemophilus influenzae type b (Hib) vaccine. You may need this if you have certain conditions. Talk to your health care provider about which screenings and vaccines you need and how often you need them. This information is not intended to replace advice given to you by your health care provider. Make sure you discuss any questions you have with your health care provider. Document Released: 11/10/2015 Document Revised: 12/04/2017 Document Reviewed: 08/15/2015 Elsevier Interactive Patient Education  2019 [employer].

## 2018-12-17 NOTE — Progress Notes (Signed)
Name: Jimmy Bryant   MRN: [id number]    DOB: October 08, 1971   Date:12/17/2018       Progress Note  Subjective  Chief Complaint  Chief Complaint  Patient presents with  . Annual Exam    HPI  Patient presents for annual CPE and follow up.  Morbid obesity: he had gastric bypass surgery on 11/09/2018 done at [facility] by Dr. Shawna Orleans. He tolerated procedure well, at the day of surgery his weight was 313 lbs and he is down to 279 lbs today. He is on high protein diet, not drinking enough water. No dizziness, sob or calf pain, he has been eating some solids. He states one of the incisions is a little itchy but no oozing  Major Depression: on higher doses of medication and is doing well at this time, denies suicidal thoughts or ideation Doing well in remission.   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   HTN: off medication since surgery, bariatric and doing well, bp is at goal, no chest pain or palpitation   Hyperlipidemia: he is taking Atorvastatin and denies muscle pain, no chest pain. Still taking medication   OSA: using CPAP machine every night, all night, he has one machine for the house and one for his truck. Feels more rested, he has also noticed that he can dream now. He denies morning headaches.  Unchanged   GERD: no problems, still on pantoprazole  USPSTF grade A and B recommendations:  Diet: eating a high protein diet, liquids mostly since bariatric surgery on 11/09/2018 Exercise: only walks at work   Depression:  Depression screen [facility] 2/9 12/17/2018 09/14/2018 06/15/2018 01/09/2018 01/09/2018  Decreased Interest 0 - 0 0 0  Down, Depressed, Hopeless 0 - 0 0 0  PHQ - 2 Score 0 - 0 0 0  Altered sleeping 0 - 0 1 -  Tired, decreased energy 0 - 1 2 -  Change in appetite 0 - 0 1 -  Feeling bad or failure about yourself  0 - 0 0 -  Trouble concentrating 0 - 0 0 -   Moving slowly or fidgety/restless 0 - 0 0 -  Suicidal thoughts 0 - 0 0 -  PHQ-9 Score 0 - 1 4 -  Difficult doing work/chores Not difficult at all Not difficult at all - - -  Some recent data might be hidden    Hypertension:  BP Readings from Last 3 Encounters:  12/17/18 130/84  10/29/18 (!) 150/76  09/14/18 122/76    Obesity: Wt Readings from Last 3 Encounters:  12/17/18 279 lb 3.2 oz (126.6 kg)  10/29/18 (!) 311 lb 9.6 oz (141.3 kg)  09/14/18 (!) 317 lb (143.8 kg)   BMI Readings from Last 3 Encounters:  12/17/18 40.64 kg/m  10/29/18 44.71 kg/m  09/14/18 45.48 kg/m     Lipids:  Lab Results  Component Value Date   CHOL 105 09/14/2018   CHOL 137 06/15/2018   CHOL 133 06/20/2017   Lab Results  Component Value Date   HDL 25 (A) 09/14/2018   HDL 27 (L) 06/15/2018   HDL 31 (L) 06/20/2017   Lab Results  Component Value Date   LDLCALC 54 09/14/2018   LDLCALC 83 06/15/2018   LDLCALC 76 06/20/2017   Lab Results  Component Value Date   TRIG 129 09/14/2018   TRIG 137 06/15/2018   TRIG 131 06/20/2017  Lab Results  Component Value Date   CHOLHDL 5.1 (H) 06/15/2018   CHOLHDL 4.3 06/20/2017   CHOLHDL 4.3 12/13/2016   No results found for: LDLDIRECT Glucose:  Glucose  Date Value Ref Range Status  06/15/2018 99 65 - 99 mg/dL Final  09/12/2017 102 (H) 65 - 99 mg/dL Final  06/20/2017 104 (H) 65 - 99 mg/dL Final   Glucose, Bld  Date Value Ref Range Status  05/31/2016 107 (H) 65 - 99 mg/dL Final      [facility] Visit from 06/15/2018 [facility]  AUDIT-C Score  0       Married STD testing and prevention (HIV/chl/gon/syphilis): he agreed on HIV   Skin cancer: discussed atypical lesion  Colorectal cancer: due this year , he wants to wait until later in the year  Prostate cancer: no family history   IPSS Questionnaire (AUA-7): Over the past month.   1)  How often have you had a sensation of not emptying your bladder completely after  you finish urinating?  0 - Not at all  2)  How often have you had to urinate again less than two hours after you finished urinating? 0 - Not at all  3)  How often have you found you stopped and started again several times when you urinated?  0 - Not at all  4) How difficult have you found it to postpone urination?  0 - Not at all  5) How often have you had a weak urinary stream?  0 - Not at all  6) How often have you had to push or strain to begin urination?  0 - Not at all  7) How many times did you most typically get up to urinate from the time you went to bed until the time you got up in the morning?  0 - None  Total score:  0-7 mildly symptomatic   8-19 moderately symptomatic   20-35 severely symptomatic    Lung cancer:   Low Dose CT Chest recommended if Age 14-80 years, 30 pack-year currently smoking OR have quit w/in 15years. Patient does not qualify.   AAA:  The USPSTF recommends one-time screening with ultrasonography in men ages 51 to 36 years who have ever smoked N/A ECG:  2012  Advanced Care Planning: A voluntary discussion about advance care planning including the explanation and discussion of advance directives.  Discussed health care proxy and Living will, and the patient was able to identify a health care proxy as wife   Patient does not have a living will at present time  Patient Active Problem List   Diagnosis Date Noted  . Mild episode of recurrent major depressive disorder ([place]) 12/29/2015  . Dyslipidemia associated with type 2 diabetes mellitus ([place]) 04/30/2015  . Circadian rhythm sleep disorder, shift work type 04/23/2015  . Dyslipidemia 04/23/2015  . H/O lumbosacral spine surgery 04/23/2015  . H/O umbilical hernia repair 18/56/3149  . Dysmetabolic syndrome 70/26/3785  . Morbid (severe) obesity due to excess calories ([place]) 04/23/2015  . ED (erectile dysfunction) of organic origin 04/23/2015  . Obstructive apnea 04/23/2015  . Vitamin D deficiency 04/23/2015  .  Decreased testosterone level 04/23/2015  . Gastro-esophageal reflux disease without esophagitis 08/22/2009    Past Surgical History:  Procedure Laterality Date  . microdecompression lumbar spine  02/2011   L4-5 and L5-S1 by Dr. Babette Relic  . UMBILICAL HERNIA REPAIR  07/10/2011    Family History  Problem Relation Age of Onset  . Diabetes  Mother   . Hypertension Father   . CAD Father   . Hypertension Sister   . Hypertension Daughter   . ADD / ADHD Son     Social History   Socioeconomic History  . Marital status: Married    Spouse name: Claiborne Billings  . Number of children: 1  . Years of education: Not on file  . Highest education level: 8th grade  Occupational History  . Not on file  Social Needs  . Financial resource strain: Somewhat hard  . Food insecurity:    Worry: Sometimes true    Inability: Sometimes true  . Transportation needs:    Medical: No    Non-medical: No  Tobacco Use  . Smoking status: Former Smoker    Packs/day: 1.00    Years: 31.00    Pack years: 31.00    Types: Cigarettes    Start date: 10/28/1980    Last attempt to quit: 06/28/2012    Years since quitting: 6.4  . Smokeless tobacco: Never Used  Substance and Sexual Activity  . Alcohol use: Yes    Alcohol/week: 0.0 standard drinks    Comment: rarely-a beer a month  . Drug use: No  . Sexual activity: Yes    Partners: Female  Lifestyle  . Physical activity:    Days per week: 5 days    Minutes per session: 60 min  . Stress: Not at all  Relationships  . Social connections:    Talks on phone: More than three times a week    Gets together: Once a week    Attends religious service: Never    Active member of club or organization: No    Attends meetings of clubs or organizations: Never    Relationship status: Married  . Intimate partner violence:    Fear of current or ex partner: No    Emotionally abused: No    Physically abused: No    Forced sexual activity: No  Other Topics Concern  . Not on file   Social History Narrative  . Not on file     Current Outpatient Medications:  .  Cholecalciferol (VITAMIN D3) 2000 UNITS capsule, Take 1 capsule by mouth daily., Disp: , Rfl:  .  atorvastatin (LIPITOR) 40 MG tablet, Take 1 tablet (40 mg total) by mouth daily., Disp: 90 tablet, Rfl: 1 .  pantoprazole (PROTONIX) 40 MG tablet, Take 1 tablet (40 mg total) by mouth daily., Disp: 90 tablet, Rfl: 0 .  venlafaxine XR (EFFEXOR-XR) 150 MG 24 hr capsule, TAKE 1 CAPSULE(75 MG) BY MOUTH DAILY WITH BREAKFAST, Disp: 90 capsule, Rfl: 1  Allergies  Allergen Reactions  . Oxycodone-Acetaminophen Itching  . Trulicity [Dulaglutide] Nausea Only    And diarrhea     ROS  Constitutional: Negative for fever , positive for weight change.  Respiratory: Negative for cough and shortness of breath.   Cardiovascular: Negative for chest pain or palpitations.  Gastrointestinal: Negative for abdominal pain, no bowel changes.  Musculoskeletal: Negative for gait problem or joint swelling.  Skin: positive  for rash, irritation around one of the incisions   Neurological: Negative for dizziness or headache.  No other specific complaints in a complete review of systems (except as listed in HPI above).  Objective  Vitals:   12/17/18 1146  BP: 130/84  Pulse: 79  Resp: 16  Temp: 97.8 F (36.6 C)  TempSrc: Oral  SpO2: 99%  Weight: 279 lb 3.2 oz (126.6 kg)  Height: 5' 9.5" (1.765 m)  Body mass index is 40.64 kg/m.  Physical Exam  Constitutional: Patient appears well-developed and well-nourished. No distress.  HENT: Head: Normocephalic and atraumatic. Ears: B TMs ok, no erythema or effusion; Nose: Nose normal. Mouth/Throat: Oropharynx is clear and moist. No oropharyngeal exudate.  Eyes: Conjunctivae and EOM are normal. Pupils are equal, round, and reactive to light. No scleral icterus.  Neck: Normal range of motion. Neck supple. No JVD present. No thyromegaly present.  Cardiovascular: Normal rate, regular  rhythm and normal heart sounds.  No murmur heard. No BLE edema. Pulmonary/Chest: Effort normal and breath sounds normal. No respiratory distress. Abdominal: Soft. Bowel sounds are normal, no distension. There is no tenderness. no masses MALE GENITALIA: Normal descended testes bilaterally, no masses palpated, no hernias, no lesions, no discharge RECTAL:not done  Musculoskeletal: Normal range of motion, no joint effusions. No gross deformities Neurological: he is alert and oriented to person, place, and time. No cranial nerve deficit. Coordination, balance, strength, speech and gait are normal.  Skin: Skin is warm and dry. Incisions healing well, LLQ incision has some erythema , no oozing  Psychiatric: Patient has a normal mood and affect. behavior is normal. Judgment and thought content normal.  PHQ2/9: Depression screen [facility] 2/9 12/17/2018 09/14/2018 06/15/2018 01/09/2018 01/09/2018  Decreased Interest 0 - 0 0 0  Down, Depressed, Hopeless 0 - 0 0 0  PHQ - 2 Score 0 - 0 0 0  Altered sleeping 0 - 0 1 -  Tired, decreased energy 0 - 1 2 -  Change in appetite 0 - 0 1 -  Feeling bad or failure about yourself  0 - 0 0 -  Trouble concentrating 0 - 0 0 -  Moving slowly or fidgety/restless 0 - 0 0 -  Suicidal thoughts 0 - 0 0 -  PHQ-9 Score 0 - 1 4 -  Difficult doing work/chores Not difficult at all Not difficult at all - - -  Some recent data might be hidden     Fall Risk: Fall Risk  12/17/2018 10/29/2018 06/15/2018 01/09/2018 10/09/2017  Falls in the past year? 0 0 Yes Yes Yes  Number falls in past yr: 0 0 1 1 1   Injury with Fall? 0 0 Yes Yes Yes  Comment - - - Fell off of a truck trailor -  Follow up - - - - -     Functional Status Survey: Is the patient deaf or have difficulty hearing?: No Does the patient have difficulty seeing, even when wearing glasses/contacts?: No Does the patient have difficulty concentrating, remembering, or making decisions?: No Does the patient have difficulty  walking or climbing stairs?: No Does the patient have difficulty dressing or bathing?: No Does the patient have difficulty doing errands alone such as visiting a doctor's office or shopping?: No    Assessment & Plan  1. Dyslipidemia associated with type 2 diabetes mellitus (HCC)  - Hemoglobin A1c  2. Encounter for screening for HIV  - HIV Antibody (routine testing w rflx)  3. Morbid (severe) obesity due to excess calories ([place])  Losing weight, s/p bariatric surgery   4. Mild major depression (HCC)  - Comprehensive metabolic panel - venlafaxine XR (EFFEXOR-XR) 150 MG 24 hr capsule; TAKE 1 CAPSULE(75 MG) BY MOUTH DAILY WITH BREAKFAST  Dispense: 90 capsule; Refill: 1  5. Well adult exam   6. Status post bariatric surgery  - CBC with Differential/Platelet - Comprehensive metabolic panel  7. Mild major depression (HCC)  - Comprehensive metabolic panel - venlafaxine XR (  EFFEXOR-XR) 150 MG 24 hr capsule; TAKE 1 CAPSULE(75 MG) BY MOUTH DAILY WITH BREAKFAST  Dispense: 90 capsule; Refill: 1  8. Dyslipidemia  - atorvastatin (LIPITOR) 40 MG tablet; Take 1 tablet (40 mg total) by mouth daily.  Dispense: 90 tablet; Refill: 1  9. Gastroesophageal reflux disease without esophagitis  - pantoprazole (PROTONIX) 40 MG tablet; Take 1 tablet (40 mg total) by mouth daily.  Dispense: 90 tablet; Refill: 0    -Prostate cancer screening and PSA options (with potential risks and benefits of testing vs not testing) were discussed along with recent recs/guidelines. -USPSTF grade A and B recommendations reviewed with patient; age-appropriate recommendations, preventive care, screening tests, etc discussed and encouraged; healthy living encouraged; see AVS for patient education given to patient -Discussed importance of 150 minutes of physical activity weekly, eat two servings of fish weekly, eat one serving of tree nuts ( cashews, pistachios, pecans, almonds.Marland Kitchen) every other day, eat 6 servings of  fruit/vegetables daily and drink plenty of water and avoid sweet beverages.

## 2018-12-18 ENCOUNTER — Encounter: Payer: Managed Care, Other (non HMO) | Admitting: Family Medicine

## 2018-12-18 LAB — HEMOGLOBIN A1C
Est. average glucose Bld gHb Est-mCnc: 108 mg/dL
Hgb A1c MFr Bld: 5.4 % (ref 4.8–5.6)

## 2018-12-18 LAB — CBC WITH DIFFERENTIAL/PLATELET
Basophils Absolute: 0.1 10*3/uL (ref 0.0–0.2)
Basos: 1 %
EOS (ABSOLUTE): 0.2 10*3/uL (ref 0.0–0.4)
Eos: 3 %
Hematocrit: 39.6 % (ref 37.5–51.0)
Hemoglobin: 12.9 g/dL — ABNORMAL LOW (ref 13.0–17.7)
Immature Grans (Abs): 0 10*3/uL (ref 0.0–0.1)
Immature Granulocytes: 0 %
Lymphocytes Absolute: 2.5 10*3/uL (ref 0.7–3.1)
Lymphs: 37 %
MCH: 26.2 pg — ABNORMAL LOW (ref 26.6–33.0)
MCHC: 32.6 g/dL (ref 31.5–35.7)
MCV: 81 fL (ref 79–97)
Monocytes Absolute: 0.6 10*3/uL (ref 0.1–0.9)
Monocytes: 9 %
Neutrophils Absolute: 3.5 10*3/uL (ref 1.4–7.0)
Neutrophils: 50 %
Platelets: 327 10*3/uL (ref 150–450)
RBC: 4.92 x10E6/uL (ref 4.14–5.80)
RDW: 15.2 % (ref 11.6–15.4)
WBC: 6.9 10*3/uL (ref 3.4–10.8)

## 2018-12-18 LAB — COMPREHENSIVE METABOLIC PANEL
ALT: 34 IU/L (ref 0–44)
AST: 31 IU/L (ref 0–40)
Albumin/Globulin Ratio: 1.6 (ref 1.2–2.2)
Albumin: 4.7 g/dL (ref 4.0–5.0)
Alkaline Phosphatase: 81 IU/L (ref 39–117)
BUN/Creatinine Ratio: 17 (ref 9–20)
BUN: 15 mg/dL (ref 6–24)
Bilirubin Total: 0.6 mg/dL (ref 0.0–1.2)
CO2: 26 mmol/L (ref 20–29)
Calcium: 10.1 mg/dL (ref 8.7–10.2)
Chloride: 102 mmol/L (ref 96–106)
Creatinine, Ser: 0.89 mg/dL (ref 0.76–1.27)
GFR calc Af Amer: 116 mL/min/{1.73_m2} (ref >59)
GFR calc non Af Amer: 100 mL/min/{1.73_m2} (ref >59)
Globulin, Total: 2.9 g/dL (ref 1.5–4.5)
Glucose: 88 mg/dL (ref 65–99)
Potassium: 4.6 mmol/L (ref 3.5–5.2)
Sodium: 143 mmol/L (ref 134–144)
Total Protein: 7.6 g/dL (ref 6.0–8.5)

## 2018-12-18 LAB — HIV ANTIBODY (ROUTINE TESTING W REFLEX): HIV Screen 4th Generation wRfx: NONREACTIVE

## 2018-12-19 ENCOUNTER — Encounter: Payer: Self-pay | Admitting: Family Medicine

## 2018-12-21 ENCOUNTER — Other Ambulatory Visit: Payer: Self-pay | Admitting: Family Medicine

## 2018-12-21 MED ORDER — SILDENAFIL CITRATE 50 MG PO TABS: 50.0000 mg | ORAL_TABLET | Freq: Every day | ORAL | 0 refills | Status: DC | PRN

## 2019-02-23 DIAGNOSIS — Z9884 Bariatric surgery status: Secondary | ICD-10-CM | POA: Insufficient documentation

## 2019-03-20 LAB — HM DIABETES EYE EXAM

## 2019-04-02 ENCOUNTER — Encounter: Payer: Self-pay | Admitting: Family Medicine

## 2019-04-02 NOTE — Telephone Encounter (Signed)
Please make patient an appointment.

## 2019-04-21 ENCOUNTER — Encounter: Payer: Self-pay | Admitting: Family Medicine

## 2019-04-21 ENCOUNTER — Other Ambulatory Visit: Payer: Self-pay

## 2019-04-21 ENCOUNTER — Ambulatory Visit (INDEPENDENT_AMBULATORY_CARE_PROVIDER_SITE_OTHER): Payer: Managed Care, Other (non HMO) | Admitting: Family Medicine

## 2019-04-21 VITALS — Wt 243.0 lb

## 2019-04-21 DIAGNOSIS — Z9884 Bariatric surgery status: Secondary | ICD-10-CM

## 2019-04-21 DIAGNOSIS — G4733 Obstructive sleep apnea (adult) (pediatric): Secondary | ICD-10-CM | POA: Diagnosis not present

## 2019-04-21 DIAGNOSIS — E559 Vitamin D deficiency, unspecified: Secondary | ICD-10-CM

## 2019-04-21 DIAGNOSIS — E1169 Type 2 diabetes mellitus with other specified complication: Secondary | ICD-10-CM | POA: Diagnosis not present

## 2019-04-21 DIAGNOSIS — E785 Hyperlipidemia, unspecified: Secondary | ICD-10-CM

## 2019-04-21 DIAGNOSIS — F330 Major depressive disorder, recurrent, mild: Secondary | ICD-10-CM

## 2019-04-21 MED ORDER — ARIPIPRAZOLE 2 MG PO TABS: 2.0000 mg | ORAL_TABLET | Freq: Every day | ORAL | 0 refills | Status: DC

## 2019-04-21 NOTE — Progress Notes (Signed)
Name: Jimmy Bryant   MRN: [id number]    DOB: [birth year]   Date:04/21/2019       Progress Note  Subjective  Chief Complaint  Chief Complaint  Patient presents with  . Medication Management    I connected with  [name]  on 04/21/19 at  8:20 AM EDT by a video enabled telemedicine application and verified that I am speaking with the correct person using two identifiers.  I discussed the limitations of evaluation and management by telemedicine and the availability of in person appointments. The patient expressed understanding and agreed to proceed. Staff also discussed with the patient that there may be a patient responsible charge related to this service. Patient Location: at work  Provider Location: [place]   HPI  Morbid obesity: he had gastric bypass surgery on 11/09/2018 done at [facility] by Dr. Shawna Orleans. He tolerated procedure well, at the day of surgery his weight was 313 lbs and he is down to 243  lbs today. He is on high protein diet, he drinks a protein shake in am's, mostly protein snacks during the day. No dumping. He has been able to drink fluids well.   Major Depression: he states he is feeling well, however his sister said he is moody, his wife said he is snappy and his mother is worried about him. He states feels tired when he gets home from work, sometimes feels like walking on egg shells at home. He denies being upset at work but around 2 pm he feels very tired, staying hydrated.   DM with ED:he has been off medication since bariatric surgery on 11/09/18, and glucose at home has been 90's  he denies polyphagia, polydipsia or polyuria.   HTN: off medication since surgery, bariatric and doing well, bp is at goal, no chest pain or palpitation . Unchanged  GERD: he is off protonix and is doing well. No heartburn or indigestion  Hyperlipidemia: he is taking Atorvastatin and denies muscle pain, no chest pain. Still taking medication   OSA: using  CPAP machine every night, all night, he has one machine for the house and one for his truck. He has noticed that since his weight loss, his mouth has been very dry , he may need another study. He states tried sleeping without it . He will let me know when he is ready for a repeat study   Patient Active Problem List   Diagnosis Date Noted  . Mild episode of recurrent major depressive disorder ([place]) 12/29/2015  . Dyslipidemia associated with type 2 diabetes mellitus ([place]) 04/30/2015  . Circadian rhythm sleep disorder, shift work type 04/23/2015  . Dyslipidemia 04/23/2015  . H/O lumbosacral spine surgery 04/23/2015  . H/O umbilical hernia repair 84/13/2440  . Dysmetabolic syndrome 08/24/2535  . Morbid (severe) obesity due to excess calories ([place]) 04/23/2015  . ED (erectile dysfunction) of organic origin 04/23/2015  . Obstructive apnea 04/23/2015  . Vitamin D deficiency 04/23/2015  . Decreased testosterone level 04/23/2015  . Gastro-esophageal reflux disease without esophagitis 08/22/2009    Past Surgical History:  Procedure Laterality Date  . microdecompression lumbar spine  02/2011   L4-5 and L5-S1 by Dr. Babette Relic  . UMBILICAL HERNIA REPAIR  07/10/2011    Family History  Problem Relation Age of Onset  . Diabetes Mother   . Hypertension Father   . CAD Father   . Hypertension Sister   . Hypertension Daughter   . ADD / ADHD Son  Social History   Socioeconomic History  . Marital status: Married    Spouse name: Claiborne Billings  . Number of children: 1  . Years of education: Not on file  . Highest education level: 8th grade  Occupational History  . Not on file  Social Needs  . Financial resource strain: Somewhat hard  . Food insecurity    Worry: Sometimes true    Inability: Sometimes true  . Transportation needs    Medical: No    Non-medical: No  Tobacco Use  . Smoking status: Former Smoker    Packs/day: 1.00    Years: 31.00    Pack years: 31.00    Types: Cigarettes     Start date: 10/28/1980    Quit date: 06/28/2012    Years since quitting: 6.8  . Smokeless tobacco: Never Used  Substance and Sexual Activity  . Alcohol use: Yes    Alcohol/week: 0.0 standard drinks    Comment: rarely-a beer a month  . Drug use: No  . Sexual activity: Yes    Partners: Female  Lifestyle  . Physical activity    Days per week: 5 days    Minutes per session: 60 min  . Stress: Not at all  Relationships  . Social connections    Talks on phone: More than three times a week    Gets together: Once a week    Attends religious service: Never    Active member of club or organization: No    Attends meetings of clubs or organizations: Never    Relationship status: Married  . Intimate partner violence    Fear of current or ex partner: No    Emotionally abused: No    Physically abused: No    Forced sexual activity: No  Other Topics Concern  . Not on file  Social History Narrative  . Not on file     Current Outpatient Medications:  .  atorvastatin (LIPITOR) 40 MG tablet, Take 1 tablet (40 mg total) by mouth daily., Disp: 90 tablet, Rfl: 1 .  Cholecalciferol (VITAMIN D3) 2000 UNITS capsule, Take 1 capsule by mouth daily., Disp: , Rfl:  .  sildenafil (VIAGRA) 50 MG tablet, Take 1-2 tablets (50-100 mg total) by mouth daily as needed for erectile dysfunction., Disp: 10 tablet, Rfl: 0 .  venlafaxine XR (EFFEXOR-XR) 150 MG 24 hr capsule, TAKE 1 CAPSULE(75 MG) BY MOUTH DAILY WITH BREAKFAST, Disp: 90 capsule, Rfl: 1 .  ARIPiprazole (ABILIFY) 2 MG tablet, Take 1 tablet (2 mg total) by mouth daily., Disp: 30 tablet, Rfl: 0  Allergies  Allergen Reactions  . Oxycodone-Acetaminophen Itching  . Trulicity [Dulaglutide] Nausea Only    And diarrhea    I personally reviewed active problem list, medication list, allergies, family history, social history with the patient/caregiver today.   ROS  Constitutional: Negative for fever, positive for  weight change.  Respiratory: Negative for  cough and shortness of breath.   Cardiovascular: Negative for chest pain or palpitations.  Gastrointestinal: Negative for abdominal pain, no bowel changes.  Musculoskeletal: Negative for gait problem or joint swelling.  Skin: Negative for rash.  Neurological: Negative for dizziness or headache.  No other specific complaints in a complete review of systems (except as listed in HPI above).  Objective  Virtual encounter, vitals not obtained.  Body mass index is 35.37 kg/m.  Physical Exam  Awake, alert and oriented  PHQ2/9: Depression screen [facility] 2/9 04/21/2019 12/17/2018 09/14/2018 06/15/2018 01/09/2018  Decreased Interest 1 0 - 0 0  Down, Depressed, Hopeless 2 0 - 0 0  PHQ - 2 Score 3 0 - 0 0  Altered sleeping 0 0 - 0 1  Tired, decreased energy 3 0 - 1 2  Change in appetite 0 0 - 0 1  Feeling bad or failure about yourself  0 0 - 0 0  Trouble concentrating 3 0 - 0 0  Moving slowly or fidgety/restless 0 0 - 0 0  Suicidal thoughts 0 0 - 0 0  PHQ-9 Score 9 0 - 1 4  Difficult doing work/chores Not difficult at all Not difficult at all Not difficult at all - -  Some recent data might be hidden   PHQ-2/9 Result is positive.    Fall Risk: Fall Risk  04/21/2019 12/17/2018 10/29/2018 06/15/2018 01/09/2018  Falls in the past year? 0 0 0 Yes Yes  Number falls in past yr: 0 0 0 1 1  Injury with Fall? 0 0 0 Yes Yes  Comment - - - - Fell off of a truck trailor  Follow up - - - - -     Assessment & Plan  1. Mild episode of recurrent major depressive disorder (HCC)  - ARIPiprazole (ABILIFY) 2 MG tablet; Take 1 tablet (2 mg total) by mouth daily.  Dispense: 30 tablet; Refill: 0 - TSH  2. Dyslipidemia associated with type 2 diabetes mellitus (HCC)  - Comprehensive metabolic panel - Hemoglobin A1c - Lipid panel  3. Morbid (severe) obesity due to excess calories ([place])  Doing well with weight loss  4. Obstructive apnea  - CBC with Differential/Platelet  5. Vitamin D deficiency  -  VITAMIN D 25 Hydroxy (Vit-D Deficiency, Fractures)  6. History of bariatric surgery  - B12 and Folate Panel - Comprehensive metabolic panel - CBC with Differential/Platelet   I discussed the assessment and treatment plan with the patient. The patient was provided an opportunity to ask questions and all were answered. The patient agreed with the plan and demonstrated an understanding of the instructions.  The patient was advised to call back or seek an in-person evaluation if the symptoms worsen or if the condition fails to improve as anticipated.  I provided 25  minutes of non-face-to-face time during this encounter.

## 2019-04-22 ENCOUNTER — Ambulatory Visit: Payer: Managed Care, Other (non HMO) | Admitting: Family Medicine

## 2019-04-23 LAB — LIPID PANEL
Chol/HDL Ratio: 2.8 ratio (ref 0.0–5.0)
Cholesterol, Total: 108 mg/dL (ref 100–199)
HDL: 38 mg/dL — ABNORMAL LOW (ref >39)
LDL Calculated: 55 mg/dL (ref 0–99)
Triglycerides: 75 mg/dL (ref 0–149)
VLDL Cholesterol Cal: 15 mg/dL (ref 5–40)

## 2019-04-23 LAB — VITAMIN D 25 HYDROXY (VIT D DEFICIENCY, FRACTURES): Vit D, 25-Hydroxy: 43.2 ng/mL (ref 30.0–100.0)

## 2019-04-23 LAB — COMPREHENSIVE METABOLIC PANEL
ALT: 42 IU/L (ref 0–44)
AST: 28 IU/L (ref 0–40)
Albumin/Globulin Ratio: 1.8 (ref 1.2–2.2)
Albumin: 4.4 g/dL (ref 4.0–5.0)
Alkaline Phosphatase: 96 IU/L (ref 39–117)
BUN/Creatinine Ratio: 21 — ABNORMAL HIGH (ref 9–20)
BUN: 17 mg/dL (ref 6–24)
Bilirubin Total: 0.6 mg/dL (ref 0.0–1.2)
CO2: 26 mmol/L (ref 20–29)
Calcium: 9.7 mg/dL (ref 8.7–10.2)
Chloride: 101 mmol/L (ref 96–106)
Creatinine, Ser: 0.8 mg/dL (ref 0.76–1.27)
GFR calc Af Amer: 121 mL/min/{1.73_m2} (ref >59)
GFR calc non Af Amer: 105 mL/min/{1.73_m2} (ref >59)
Globulin, Total: 2.5 g/dL (ref 1.5–4.5)
Glucose: 83 mg/dL (ref 65–99)
Potassium: 3.9 mmol/L (ref 3.5–5.2)
Sodium: 143 mmol/L (ref 134–144)
Total Protein: 6.9 g/dL (ref 6.0–8.5)

## 2019-04-23 LAB — B12 AND FOLATE PANEL
Folate: 18.3 ng/mL (ref >3.0)
Vitamin B-12: 380 pg/mL (ref 232–1245)

## 2019-04-23 LAB — CBC WITH DIFFERENTIAL/PLATELET
Basophils Absolute: 0.1 10*3/uL (ref 0.0–0.2)
Basos: 1 %
EOS (ABSOLUTE): 0.1 10*3/uL (ref 0.0–0.4)
Eos: 2 %
Hematocrit: 38.4 % (ref 37.5–51.0)
Hemoglobin: 12.5 g/dL — ABNORMAL LOW (ref 13.0–17.7)
Immature Grans (Abs): 0 10*3/uL (ref 0.0–0.1)
Immature Granulocytes: 0 %
Lymphocytes Absolute: 2.1 10*3/uL (ref 0.7–3.1)
Lymphs: 33 %
MCH: 26.6 pg (ref 26.6–33.0)
MCHC: 32.6 g/dL (ref 31.5–35.7)
MCV: 82 fL (ref 79–97)
Monocytes Absolute: 0.5 10*3/uL (ref 0.1–0.9)
Monocytes: 8 %
Neutrophils Absolute: 3.5 10*3/uL (ref 1.4–7.0)
Neutrophils: 56 %
Platelets: 273 10*3/uL (ref 150–450)
RBC: 4.7 x10E6/uL (ref 4.14–5.80)
RDW: 14.3 % (ref 11.6–15.4)
WBC: 6.3 10*3/uL (ref 3.4–10.8)

## 2019-04-23 LAB — TSH: TSH: 2.21 u[IU]/mL (ref 0.450–4.500)

## 2019-04-23 LAB — HEMOGLOBIN A1C
Est. average glucose Bld gHb Est-mCnc: 114 mg/dL
Hgb A1c MFr Bld: 5.6 % (ref 4.8–5.6)

## 2019-04-26 ENCOUNTER — Other Ambulatory Visit: Payer: Self-pay | Admitting: Family Medicine

## 2019-04-29 LAB — SPECIMEN STATUS REPORT

## 2019-04-29 LAB — FERRITIN: Ferritin: 158 ng/mL (ref 30–400)

## 2019-05-11 ENCOUNTER — Other Ambulatory Visit: Payer: Self-pay

## 2019-05-11 ENCOUNTER — Encounter: Payer: Self-pay | Admitting: Family Medicine

## 2019-05-11 ENCOUNTER — Ambulatory Visit (INDEPENDENT_AMBULATORY_CARE_PROVIDER_SITE_OTHER): Payer: Managed Care, Other (non HMO) | Admitting: Family Medicine

## 2019-05-11 DIAGNOSIS — F330 Major depressive disorder, recurrent, mild: Secondary | ICD-10-CM | POA: Diagnosis not present

## 2019-05-11 NOTE — Progress Notes (Signed)
Name: Jimmy Bryant   MRN: [id number]    DOB: 18-Jul-1971   Date:05/11/2019       Progress Note  Subjective  Chief Complaint  Chief Complaint  Patient presents with  . Depression  . Diabetes  . Hypertension    I connected with  [name]  on 05/11/19 at  9:40 AM EDT by a video enabled telemedicine application and verified that I am speaking with the correct person using two identifiers.  I discussed the limitations of evaluation and management by telemedicine and the availability of in person appointments. The patient expressed understanding and agreed to proceed. Staff also discussed with the patient that there may be a patient responsible charge related to this service. Patient Location: at work  Provider Location: [facility]   HPI  Major Depression: he was feeling snappy and moody on his last visit, phq 9 was up to 9, we adding Abilify 2 mg at night. He states he is much more joyful, able to play with his son, wife and sister noticed an improvement of his mood, only side effect is that he is waking up around 3 am and sometimes unable to fall back asleep. Feels very rested, other times able to fall back asleep. No agitation. Advised to try taking 4 mg or try switching to the am and let me know what works best for him. He will send me a Mychart message with his preference in a few days  Patient Active Problem List   Diagnosis Date Noted  . Mild episode of recurrent major depressive disorder ([place]) 12/29/2015  . Dyslipidemia associated with type 2 diabetes mellitus ([place]) 04/30/2015  . Circadian rhythm sleep disorder, shift work type 04/23/2015  . Dyslipidemia 04/23/2015  . H/O lumbosacral spine surgery 04/23/2015  . H/O umbilical hernia repair 29/93/7169  . Dysmetabolic syndrome 67/89/3810  . Morbid (severe) obesity due to excess calories ([place]) 04/23/2015  . ED (erectile dysfunction) of organic origin 04/23/2015  . Obstructive apnea 04/23/2015  . Vitamin  D deficiency 04/23/2015  . Decreased testosterone level 04/23/2015  . Gastro-esophageal reflux disease without esophagitis 08/22/2009    Past Surgical History:  Procedure Laterality Date  . microdecompression lumbar spine  02/2011   L4-5 and L5-S1 by Dr. Babette Relic  . UMBILICAL HERNIA REPAIR  07/10/2011    Family History  Problem Relation Age of Onset  . Diabetes Mother   . Hypertension Father   . CAD Father   . Hypertension Sister   . Hypertension Daughter   . ADD / ADHD Son     Social History   Socioeconomic History  . Marital status: Married    Spouse name: Claiborne Billings  . Number of children: 1  . Years of education: Not on file  . Highest education level: 8th grade  Occupational History  . Not on file  Social Needs  . Financial resource strain: Somewhat hard  . Food insecurity    Worry: Sometimes true    Inability: Sometimes true  . Transportation needs    Medical: No    Non-medical: No  Tobacco Use  . Smoking status: Former Smoker    Packs/day: 1.00    Years: 31.00    Pack years: 31.00    Types: Cigarettes    Start date: 10/28/1980    Quit date: 06/28/2012    Years since quitting: 6.8  . Smokeless tobacco: Never Used  Substance and Sexual Activity  . Alcohol use: Yes    Alcohol/week: 0.0 standard  drinks    Comment: rarely-a beer a month  . Drug use: No  . Sexual activity: Yes    Partners: Female  Lifestyle  . Physical activity    Days per week: 5 days    Minutes per session: 60 min  . Stress: Not at all  Relationships  . Social connections    Talks on phone: More than three times a week    Gets together: Once a week    Attends religious service: Never    Active member of club or organization: No    Attends meetings of clubs or organizations: Never    Relationship status: Married  . Intimate partner violence    Fear of current or ex partner: No    Emotionally abused: No    Physically abused: No    Forced sexual activity: No  Other Topics Concern  .  Not on file  Social History Narrative  . Not on file     Current Outpatient Medications:  .  ARIPiprazole (ABILIFY) 2 MG tablet, Take 1 tablet (2 mg total) by mouth daily., Disp: 30 tablet, Rfl: 0 .  atorvastatin (LIPITOR) 40 MG tablet, Take 1 tablet (40 mg total) by mouth daily., Disp: 90 tablet, Rfl: 1 .  Cholecalciferol (VITAMIN D3) 2000 UNITS capsule, Take 1 capsule by mouth daily., Disp: , Rfl:  .  sildenafil (VIAGRA) 50 MG tablet, Take 1-2 tablets (50-100 mg total) by mouth daily as needed for erectile dysfunction., Disp: 10 tablet, Rfl: 0 .  venlafaxine XR (EFFEXOR-XR) 150 MG 24 hr capsule, TAKE 1 CAPSULE(75 MG) BY MOUTH DAILY WITH BREAKFAST, Disp: 90 capsule, Rfl: 1  Allergies  Allergen Reactions  . Oxycodone-Acetaminophen Itching  . Trulicity [Dulaglutide] Nausea Only    And diarrhea    I personally reviewed active problem list, medication list, allergies, family history with the patient/caregiver today.   ROS  Ten systems reviewed and is negative except as mentioned in HPI   Objective  Virtual encounter, vitals not obtained. Weight is down to 238 lbs   Body mass index is 34.64 kg/m.  Physical Exam  Awake, alert and oriented   PHQ2/9: Depression screen [facility] 2/9 05/11/2019 04/21/2019 12/17/2018 09/14/2018 06/15/2018  Decreased Interest 0 1 0 - 0  Down, Depressed, Hopeless 0 2 0 - 0  PHQ - 2 Score 0 3 0 - 0  Altered sleeping 3 0 0 - 0  Tired, decreased energy 0 3 0 - 1  Change in appetite 0 0 0 - 0  Feeling bad or failure about yourself  0 0 0 - 0  Trouble concentrating 0 3 0 - 0  Moving slowly or fidgety/restless 0 0 0 - 0  Suicidal thoughts 0 0 0 - 0  PHQ-9 Score 3 9 0 - 1  Difficult doing work/chores Not difficult at all Not difficult at all Not difficult at all Not difficult at all -  Some recent data might be hidden   PHQ-2/9 Result is negative.    Fall Risk: Fall Risk  04/21/2019 12/17/2018 10/29/2018 06/15/2018 01/09/2018  Falls in the past year? 0 0 0  Yes Yes  Number falls in past yr: 0 0 0 1 1  Injury with Fall? 0 0 0 Yes Yes  Comment - - - - Fell off of a truck trailor  Follow up - - - - -     Assessment & Plan  1. Mild episode of recurrent major depressive disorder (HCC)  Continue medications, may need to  adjust dose to 5 mg  I discussed the assessment and treatment plan with the patient. The patient was provided an opportunity to ask questions and all were answered. The patient agreed with the plan and demonstrated an understanding of the instructions.  The patient was advised to call back or seek an in-person evaluation if the symptoms worsen or if the condition fails to improve as anticipated.  I provided 15  minutes of non-face-to-face time during this encounter.

## 2019-05-18 ENCOUNTER — Encounter: Payer: Self-pay | Admitting: Family Medicine

## 2019-05-18 ENCOUNTER — Ambulatory Visit: Payer: Managed Care, Other (non HMO) | Admitting: Family Medicine

## 2019-05-19 ENCOUNTER — Other Ambulatory Visit: Payer: Self-pay | Admitting: Family Medicine

## 2019-05-19 DIAGNOSIS — F330 Major depressive disorder, recurrent, mild: Secondary | ICD-10-CM

## 2019-05-19 MED ORDER — ARIPIPRAZOLE 2 MG PO TABS: 2.0000 mg | ORAL_TABLET | Freq: Every day | ORAL | 0 refills | Status: DC

## 2019-06-20 ENCOUNTER — Other Ambulatory Visit: Payer: Self-pay | Admitting: Family Medicine

## 2019-06-20 DIAGNOSIS — F330 Major depressive disorder, recurrent, mild: Secondary | ICD-10-CM

## 2019-06-20 DIAGNOSIS — F320 Major depressive disorder, single episode, mild: Secondary | ICD-10-CM

## 2019-06-20 DIAGNOSIS — E785 Hyperlipidemia, unspecified: Secondary | ICD-10-CM

## 2019-06-30 ENCOUNTER — Other Ambulatory Visit: Payer: Self-pay | Admitting: Family Medicine

## 2019-07-12 ENCOUNTER — Ambulatory Visit: Payer: Managed Care, Other (non HMO) | Admitting: Family Medicine

## 2019-07-12 ENCOUNTER — Encounter: Payer: Self-pay | Admitting: Family Medicine

## 2019-07-12 ENCOUNTER — Other Ambulatory Visit: Payer: Self-pay

## 2019-07-12 VITALS — BP 132/72 | HR 82 | Temp 97.7°F | Resp 18 | Ht 70.0 in | Wt 236.3 lb

## 2019-07-12 DIAGNOSIS — E1169 Type 2 diabetes mellitus with other specified complication: Secondary | ICD-10-CM | POA: Diagnosis not present

## 2019-07-12 DIAGNOSIS — Z23 Encounter for immunization: Secondary | ICD-10-CM

## 2019-07-12 DIAGNOSIS — Z1211 Encounter for screening for malignant neoplasm of colon: Secondary | ICD-10-CM

## 2019-07-12 DIAGNOSIS — E785 Hyperlipidemia, unspecified: Secondary | ICD-10-CM

## 2019-07-12 DIAGNOSIS — F330 Major depressive disorder, recurrent, mild: Secondary | ICD-10-CM

## 2019-07-12 DIAGNOSIS — N529 Male erectile dysfunction, unspecified: Secondary | ICD-10-CM

## 2019-07-12 DIAGNOSIS — E538 Deficiency of other specified B group vitamins: Secondary | ICD-10-CM

## 2019-07-12 DIAGNOSIS — G4733 Obstructive sleep apnea (adult) (pediatric): Secondary | ICD-10-CM

## 2019-07-12 MED ORDER — B-12 1000 MCG SL SUBL: 1.0000 | SUBLINGUAL_TABLET | SUBLINGUAL | 1 refills | Status: DC

## 2019-07-12 MED ORDER — SILDENAFIL CITRATE 100 MG PO TABS: 100.0000 mg | ORAL_TABLET | Freq: Every day | ORAL | 2 refills | Status: DC | PRN

## 2019-07-12 MED ORDER — ARIPIPRAZOLE 5 MG PO TABS: 5.0000 mg | ORAL_TABLET | Freq: Every day | ORAL | 0 refills | Status: DC

## 2019-07-12 NOTE — Progress Notes (Signed)
Name: Jimmy Bryant   MRN: [id number]    DOB: 03/19/1971   Date:07/12/2019       Progress Note  Subjective  Chief Complaint  Chief Complaint  Patient presents with  . Medication Refill  . Depression    Takes Abilify at night but now after taking it he wakes up 3 hours into his sleep pattern  . Diabetes  . Hypertension    Denies any symptoms    HPI  Morbid obesity: hehad gastric bypass surgery on 11/09/2018 done at [facility] by Dr. Shawna Orleans. He tolerated procedure well, at the day of surgery his weight was 313 lbs and he is down to 236  lbs today. He is on high protein diet, he drinks a protein shake in am's, mostly protein snacks during the day. No dumping. He states his weight has been stable for the past few weeks, he states happy at current state he will try to go down to 215 lbs -220 lbs , explained for a BMI below 30 his weight needs to be 208 lbs. Advised to take otc B12  Major Depression: he called in June  stating feeling mood , but not sadness , he is taking Effexor in am, he is taking Abilify when he gets home, and seems to work, however not sleeping well, falls asleep but wakes up after 3 hours.   DM with ED:he has been off medication since bariatric surgery on 11/09/18, and glucose at home has been dropping occasionally, goes down to 50's and he has to eat something sweet. Episodes less than once a month and usually when he skips meals.   He denies polyphagia, polydipsia or polyuria.  HTN:off medication since surgery, bariatric and doing well, bp is at goal, no chest pain or palpitation. Doing well   GERD: he is off protonix and is doing well. No heartburn or indigestion, only had discomfort when he has a very spicy meals  Hyperlipidemia: he is taking Atorvastatin and denies muscle pain, no chest pain.Still taking medication, last LDL and HDL improved   OSA: using CPAP machine every night, all night, he has one machine for the house and one for his truck. He has  noticed that since his weight loss, his mouth has been very dry , he may need another study but he wants to wait one year to get it done .   Patient Active Problem List   Diagnosis Date Noted  . S/P gastric bypass 02/23/2019  . Mild episode of recurrent major depressive disorder ([place]) 12/29/2015  . Dyslipidemia associated with type 2 diabetes mellitus ([place]) 04/30/2015  . Circadian rhythm sleep disorder, shift work type 04/23/2015  . Dyslipidemia 04/23/2015  . H/O lumbosacral spine surgery 04/23/2015  . H/O umbilical hernia repair 123456  . Dysmetabolic syndrome 123456  . Morbid (severe) obesity due to excess calories ([place]) 04/23/2015  . ED (erectile dysfunction) of organic origin 04/23/2015  . Obstructive apnea 04/23/2015  . Vitamin D deficiency 04/23/2015  . Decreased testosterone level 04/23/2015  . Gastro-esophageal reflux disease without esophagitis 08/22/2009    Past Surgical History:  Procedure Laterality Date  . microdecompression lumbar spine  02/2011   L4-5 and L5-S1 by Dr. Babette Relic  . UMBILICAL HERNIA REPAIR  07/10/2011    Family History  Problem Relation Age of Onset  . Diabetes Mother   . Hypertension Father   . CAD Father   . Hypertension Sister   . Hypertension Daughter   . ADD / ADHD Son  Social History   Socioeconomic History  . Marital status: Married    Spouse name: Claiborne Billings  . Number of children: 1  . Years of education: Not on file  . Highest education level: 8th grade  Occupational History  . Not on file  Social Needs  . Financial resource strain: Somewhat hard  . Food insecurity    Worry: Sometimes true    Inability: Sometimes true  . Transportation needs    Medical: No    Non-medical: No  Tobacco Use  . Smoking status: Former Smoker    Packs/day: 1.00    Years: 31.00    Pack years: 31.00    Types: Cigarettes    Start date: 10/28/1980    Quit date: 06/28/2012    Years since quitting: 7.0  . Smokeless tobacco: Never Used   Substance and Sexual Activity  . Alcohol use: Yes    Alcohol/week: 0.0 standard drinks    Comment: rarely-a beer a month  . Drug use: No  . Sexual activity: Yes    Partners: Female  Lifestyle  . Physical activity    Days per week: 5 days    Minutes per session: 60 min  . Stress: Not at all  Relationships  . Social connections    Talks on phone: More than three times a week    Gets together: Once a week    Attends religious service: Never    Active member of club or organization: No    Attends meetings of clubs or organizations: Never    Relationship status: Married  . Intimate partner violence    Fear of current or ex partner: No    Emotionally abused: No    Physically abused: No    Forced sexual activity: No  Other Topics Concern  . Not on file  Social History Narrative  . Not on file     Current Outpatient Medications:  .  ARIPiprazole (ABILIFY) 2 MG tablet, TAKE 1 TABLET(2 MG) BY MOUTH DAILY, Disp: 90 tablet, Rfl: 0 .  atorvastatin (LIPITOR) 40 MG tablet, TAKE 1 TABLET(40 MG) BY MOUTH DAILY, Disp: 90 tablet, Rfl: 1 .  Cholecalciferol (VITAMIN D3) 2000 UNITS capsule, Take 1 capsule by mouth daily., Disp: , Rfl:  .  sildenafil (VIAGRA) 50 MG tablet, TAKE 1 TO 2 TABLETS(50 TO 100 MG) BY MOUTH DAILY AS NEEDED FOR ERECTILE DYSFUNCTION, Disp: 10 tablet, Rfl: 0 .  venlafaxine XR (EFFEXOR-XR) 150 MG 24 hr capsule, TAKE 1 CAPSULE(75MG ) BY MOUTH DAILY WITH BREAKFAST., Disp: 90 capsule, Rfl: 1  Allergies  Allergen Reactions  . Oxycodone-Acetaminophen Itching  . Trulicity [Dulaglutide] Nausea Only    And diarrhea    I personally reviewed active problem list, medication list, allergies, family history, social history, health maintenance with the patient/caregiver today.   ROS  Constitutional: Negative for fever, positive for  weight change.  Respiratory: Negative for cough and shortness of breath.   Cardiovascular: Negative for chest pain or palpitations.   Gastrointestinal: Negative for abdominal pain, no bowel changes.  Musculoskeletal: Negative for gait problem or joint swelling.  Skin: Negative for rash.  Neurological: Negative for dizziness or headache.  No other specific complaints in a complete review of systems (except as listed in HPI above).  Objective  Vitals:   07/12/19 1030  BP: 132/72  Pulse: 82  Resp: 18  Temp: 97.7 F (36.5 C)  TempSrc: Temporal  SpO2: 99%  Weight: 236 lb 4.8 oz (107.2 kg)  Height: 5\' 10"  (1.778 m)  Body mass index is 33.91 kg/m.  Physical Exam  Constitutional: Patient appears well-developed and well-nourished. Obese No distress.  HEENT: head atraumatic, normocephalic, pupils equal and reactive to light Cardiovascular: Normal rate, regular rhythm and normal heart sounds.  No murmur heard. No BLE edema. Pulmonary/Chest: Effort normal and breath sounds normal. No respiratory distress. Abdominal: Soft.  There is no tenderness. Psychiatric: Patient has a normal mood and affect. behavior is normal. Judgment and thought content normal.  Recent Results (from the past 2160 hour(s))  B12 and Folate Panel     Status: None   Collection Time: 04/22/19  8:05 AM  Result Value Ref Range   Vitamin B-12 380 232 - 1,245 pg/mL   Folate 18.3 >3.0 ng/mL    Comment: A serum folate concentration of less than 3.1 ng/mL is considered to represent clinical deficiency.   Comprehensive metabolic panel     Status: Abnormal   Collection Time: 04/22/19  8:05 AM  Result Value Ref Range   Glucose 83 65 - 99 mg/dL   BUN 17 6 - 24 mg/dL   Creatinine, Ser 0.80 0.76 - 1.27 mg/dL   GFR calc non Af Amer 105 >59 mL/min/1.73   GFR calc Af Amer 121 >59 mL/min/1.73   BUN/Creatinine Ratio 21 (H) 9 - 20   Sodium 143 134 - 144 mmol/L   Potassium 3.9 3.5 - 5.2 mmol/L   Chloride 101 96 - 106 mmol/L   CO2 26 20 - 29 mmol/L   Calcium 9.7 8.7 - 10.2 mg/dL   Total Protein 6.9 6.0 - 8.5 g/dL   Albumin 4.4 4.0 - 5.0 g/dL    Globulin, Total 2.5 1.5 - 4.5 g/dL   Albumin/Globulin Ratio 1.8 1.2 - 2.2   Bilirubin Total 0.6 0.0 - 1.2 mg/dL   Alkaline Phosphatase 96 39 - 117 IU/L   AST 28 0 - 40 IU/L   ALT 42 0 - 44 IU/L  CBC with Differential/Platelet     Status: Abnormal   Collection Time: 04/22/19  8:05 AM  Result Value Ref Range   WBC 6.3 3.4 - 10.8 x10E3/uL   RBC 4.70 4.14 - 5.80 x10E6/uL   Hemoglobin 12.5 (L) 13.0 - 17.7 g/dL   Hematocrit 38.4 37.5 - 51.0 %   MCV 82 79 - 97 fL   MCH 26.6 26.6 - 33.0 pg   MCHC 32.6 31.5 - 35.7 g/dL   RDW 14.3 11.6 - 15.4 %   Platelets 273 150 - 450 x10E3/uL   Neutrophils 56 Not Estab. %   Lymphs 33 Not Estab. %   Monocytes 8 Not Estab. %   Eos 2 Not Estab. %   Basos 1 Not Estab. %   Neutrophils Absolute 3.5 1.4 - 7.0 x10E3/uL   Lymphocytes Absolute 2.1 0.7 - 3.1 x10E3/uL   Monocytes Absolute 0.5 0.1 - 0.9 x10E3/uL   EOS (ABSOLUTE) 0.1 0.0 - 0.4 x10E3/uL   Basophils Absolute 0.1 0.0 - 0.2 x10E3/uL   Immature Granulocytes 0 Not Estab. %   Immature Grans (Abs) 0.0 0.0 - 0.1 x10E3/uL  Hemoglobin A1c     Status: None   Collection Time: 04/22/19  8:05 AM  Result Value Ref Range   Hgb A1c MFr Bld 5.6 4.8 - 5.6 %    Comment:          Prediabetes: 5.7 - 6.4          Diabetes: >6.4          Glycemic control for adults with  diabetes: <7.0    Est. average glucose Bld gHb Est-mCnc 114 mg/dL  TSH     Status: None   Collection Time: 04/22/19  8:05 AM  Result Value Ref Range   TSH 2.210 0.450 - 4.500 uIU/mL  Lipid panel     Status: Abnormal   Collection Time: 04/22/19  8:05 AM  Result Value Ref Range   Cholesterol, Total 108 100 - 199 mg/dL   Triglycerides 75 0 - 149 mg/dL   HDL 38 (L) >39 mg/dL   VLDL Cholesterol Cal 15 5 - 40 mg/dL   LDL Calculated 55 0 - 99 mg/dL   Chol/HDL Ratio 2.8 0.0 - 5.0 ratio    Comment:                                   T. Chol/HDL Ratio                                             Men  Women                               1/2 Avg.Risk  3.4     3.3                                   Avg.Risk  5.0    4.4                                2X Avg.Risk  9.6    7.1                                3X Avg.Risk 23.4   11.0   VITAMIN D 25 Hydroxy (Vit-D Deficiency, Fractures)     Status: None   Collection Time: 04/22/19  8:05 AM  Result Value Ref Range   Vit D, 25-Hydroxy 43.2 30.0 - 100.0 ng/mL    Comment: Vitamin D deficiency has been defined by the [place] practice guideline as a level of serum 25-OH vitamin D less than 20 ng/mL (1,2). The Endocrine Society went on to further define vitamin D insufficiency as a level between 21 and 29 ng/mL (2). 1. IOM (Institute of Medicine). 2010. Dietary reference    intakes for calcium and D. [place]: The    [employer]. 2. Holick MF, [place], Bischoff-Ferrari HA, et al.    Evaluation, treatment, and prevention of vitamin D    deficiency: an Endocrine Society clinical practice    guideline. JCEM. 2011 Jul; 96(7):1911-30.   Ferritin     Status: None   Collection Time: 04/22/19  8:05 AM  Result Value Ref Range   Ferritin 158 30 - 400 ng/mL  Specimen status report     Status: None   Collection Time: 04/22/19  8:05 AM  Result Value Ref Range   specimen status report Comment     Comment: Written Authorization Written Authorization Written Authorization Received. Authorization received from [place] 04-29-2019 Logged by Gareth Morgan  PHQ2/9: Depression screen [facility] 2/9 07/12/2019 05/11/2019 04/21/2019 12/17/2018 09/14/2018  Decreased Interest 0 0 1 0 -  Down, Depressed, Hopeless 0 0 2 0 -  PHQ - 2 Score 0 0 3 0 -  Altered sleeping 3 3 0 0 -  Tired, decreased energy 1 0 3 0 -  Change in appetite 0 0 0 0 -  Feeling bad or failure about yourself  0 0 0 0 -  Trouble concentrating 0 0 3 0 -  Moving slowly or fidgety/restless 0 0 0 0 -  Suicidal thoughts 0 0 0 0 -  PHQ-9 Score 4 3 9  0 -  Difficult doing work/chores  Somewhat difficult Not difficult at all Not difficult at all Not difficult at all Not difficult at all  Some recent data might be hidden    phq 9 is positive and negative   Fall Risk: Fall Risk  07/12/2019 04/21/2019 12/17/2018 10/29/2018 06/15/2018  Falls in the past year? 0 0 0 0 Yes  Number falls in past yr: 0 0 0 0 1  Injury with Fall? 0 0 0 0 Yes  Comment - - - - -  Follow up - - - - -     Functional Status Survey: Is the patient deaf or have difficulty hearing?: No Does the patient have difficulty seeing, even when wearing glasses/contacts?: No Does the patient have difficulty concentrating, remembering, or making decisions?: No Does the patient have difficulty walking or climbing stairs?: No Does the patient have difficulty dressing or bathing?: No Does the patient have difficulty doing errands alone such as visiting a doctor's office or shopping?: No    Assessment & Plan  1. Need for immunization against influenza  - Flu Vaccine QUAD 36+ mos IM  2. Mild episode of recurrent major depressive disorder (HCC)  - ARIPiprazole (ABILIFY) 5 MG tablet; Take 1 tablet (5 mg total) by mouth daily.  Dispense: 90 tablet; Refill: 0 We will try going up on Abilify but if no improvement switch to seroquel   3. Obstructive apnea  Still wearing CPAP , waiting at one year after surgery to repeat sleep study   4. Dyslipidemia associated with type 2 diabetes mellitus ([place])  - POCT UA - Microalbumin ( he left before specimen was ordered )   5. Dyslipidemia  Reviewed labs with patient   6. ED (erectile dysfunction) of organic origin  - sildenafil (VIAGRA) 100 MG tablet; Take 1 tablet (100 mg total) by mouth daily as needed for erectile dysfunction.  Dispense: 30 tablet; Refill: 2  7. B12 deficiency  - Cyanocobalamin (B-12) 1000 MCG SUBL; Place 1 tablet under the tongue 3 (three) times a week.  Dispense: 30 tablet; Refill: 1  8. Colon cancer screening  - Cologuard

## 2019-07-23 LAB — COLOGUARD: Cologuard: POSITIVE — AB

## 2019-07-26 ENCOUNTER — Telehealth: Payer: Self-pay

## 2019-07-26 DIAGNOSIS — Z1211 Encounter for screening for malignant neoplasm of colon: Secondary | ICD-10-CM

## 2019-07-26 DIAGNOSIS — R195 Other fecal abnormalities: Secondary | ICD-10-CM

## 2019-07-26 NOTE — Telephone Encounter (Signed)
Per the request of Dr. Steele Sizer, I tried to contact this patient to inform him that his Cologuard came back (+) and that we need to proceed with a referral to [facility] for a colonoscopy, but there was no answer.  A message was left for him to give [place] a call when he got the chance and the referral has been placed.  CRM will be placed.

## 2019-07-27 ENCOUNTER — Telehealth: Payer: Self-pay

## 2019-07-27 ENCOUNTER — Other Ambulatory Visit: Payer: Self-pay

## 2019-07-27 DIAGNOSIS — Z1211 Encounter for screening for malignant neoplasm of colon: Secondary | ICD-10-CM

## 2019-07-27 MED ORDER — NA SULFATE-K SULFATE-MG SULF 17.5-3.13-1.6 GM/177ML PO SOLN: 1.0000 | Freq: Once | ORAL | 0 refills | Status: AC

## 2019-07-27 NOTE — Telephone Encounter (Signed)
Copied from [place] [id number]. Topic: General - Other >> Jul 27, 2019  1:07 PM Rayann Heman wrote: Reason for CRM: laura calling from [employer] and stated that they faxed over results for cologuard   CB#  [phone]

## 2019-07-27 NOTE — Telephone Encounter (Signed)
Gastroenterology Pre-Procedure Review  Request Date: 08/13/2019 Requesting Physician: Dr. Allen Norris  PATIENT REVIEW QUESTIONS: The patient responded to the following health history questions as indicated:    1. Are you having any GI issues? no 2. Do you have a personal history of Polyps? no 3. Do you have a family history of Colon Cancer or Polyps? no 4. Diabetes Mellitus? no 5. Joint replacements in the past 12 months?Gastric Bypass November 15 2018 6. Major health problems in the past 3 months?no 7. Any artificial heart valves, MVP, or defibrillator?no    MEDICATIONS & ALLERGIES:    Patient reports the following regarding taking any anticoagulation/antiplatelet therapy:   Plavix, Coumadin, Eliquis, Xarelto, Lovenox, Pradaxa, Brilinta, or Effient? no Aspirin? no  Patient confirms/reports the following medications:  Current Outpatient Medications  Medication Sig Dispense Refill  . ARIPiprazole (ABILIFY) 5 MG tablet Take 1 tablet (5 mg total) by mouth daily. 90 tablet 0  . atorvastatin (LIPITOR) 40 MG tablet TAKE 1 TABLET(40 MG) BY MOUTH DAILY 90 tablet 1  . Cholecalciferol (VITAMIN D3) 2000 UNITS capsule Take 1 capsule by mouth daily.    . Cyanocobalamin (B-12) 1000 MCG SUBL Place 1 tablet under the tongue 3 (three) times a week. 30 tablet 1  . Multiple Vitamins-Minerals (MULTIVITAMIN WITH MINERALS) tablet Take 1 tablet by mouth daily.    . sildenafil (VIAGRA) 100 MG tablet Take 1 tablet (100 mg total) by mouth daily as needed for erectile dysfunction. 30 tablet 2  . venlafaxine XR (EFFEXOR-XR) 150 MG 24 hr capsule TAKE 1 CAPSULE(75MG ) BY MOUTH DAILY WITH BREAKFAST. 90 capsule 1   No current facility-administered medications for this visit.     Patient confirms/reports the following allergies:  Allergies  Allergen Reactions  . Oxycodone-Acetaminophen Itching  . Trulicity [Dulaglutide] Nausea Only    And diarrhea    No orders of the defined types were placed in this  encounter.   AUTHORIZATION INFORMATION Primary Insurance: 1D#: Group #:  Secondary Insurance: 1D#: Group #:  SCHEDULE INFORMATION: Date: 08/13/2019 Time: Location: [place]

## 2019-08-04 ENCOUNTER — Other Ambulatory Visit: Payer: Self-pay

## 2019-08-10 ENCOUNTER — Other Ambulatory Visit: Payer: Self-pay

## 2019-08-10 ENCOUNTER — Other Ambulatory Visit: Admission: RE | Admit: 2019-08-10 | Payer: Managed Care, Other (non HMO) | Source: Ambulatory Visit

## 2019-08-10 DIAGNOSIS — Z20822 Contact with and (suspected) exposure to covid-19: Secondary | ICD-10-CM

## 2019-08-11 LAB — NOVEL CORONAVIRUS, NAA: SARS-CoV-2, NAA: NOT DETECTED

## 2019-08-12 NOTE — Discharge Instructions (Signed)

## 2019-08-13 ENCOUNTER — Encounter
Admission: RE | Disposition: A | Discharge status: Home / Self Care | Payer: Self-pay | Source: Home / Self Care | Attending: Gastroenterology

## 2019-08-13 ENCOUNTER — Ambulatory Visit: Payer: Managed Care, Other (non HMO) | Admitting: Anesthesiology

## 2019-08-13 ENCOUNTER — Other Ambulatory Visit: Payer: Self-pay

## 2019-08-13 ENCOUNTER — Ambulatory Visit
Admission: RE | Admit: 2019-08-13 | Discharge: 2019-08-13 | Disposition: A | Discharge status: Home / Self Care | Payer: Managed Care, Other (non HMO) | Attending: Gastroenterology | Admitting: Gastroenterology

## 2019-08-13 DIAGNOSIS — Z818 Family history of other mental and behavioral disorders: Secondary | ICD-10-CM | POA: Insufficient documentation

## 2019-08-13 DIAGNOSIS — E785 Hyperlipidemia, unspecified: Secondary | ICD-10-CM | POA: Diagnosis not present

## 2019-08-13 DIAGNOSIS — G4733 Obstructive sleep apnea (adult) (pediatric): Secondary | ICD-10-CM | POA: Insufficient documentation

## 2019-08-13 DIAGNOSIS — Z888 Allergy status to other drugs, medicaments and biological substances status: Secondary | ICD-10-CM | POA: Diagnosis not present

## 2019-08-13 DIAGNOSIS — K621 Rectal polyp: Secondary | ICD-10-CM | POA: Diagnosis not present

## 2019-08-13 DIAGNOSIS — R0683 Snoring: Secondary | ICD-10-CM | POA: Diagnosis not present

## 2019-08-13 DIAGNOSIS — D123 Benign neoplasm of transverse colon: Secondary | ICD-10-CM | POA: Insufficient documentation

## 2019-08-13 DIAGNOSIS — F329 Major depressive disorder, single episode, unspecified: Secondary | ICD-10-CM | POA: Insufficient documentation

## 2019-08-13 DIAGNOSIS — F419 Anxiety disorder, unspecified: Secondary | ICD-10-CM | POA: Diagnosis not present

## 2019-08-13 DIAGNOSIS — Z79899 Other long term (current) drug therapy: Secondary | ICD-10-CM | POA: Insufficient documentation

## 2019-08-13 DIAGNOSIS — Z833 Family history of diabetes mellitus: Secondary | ICD-10-CM | POA: Insufficient documentation

## 2019-08-13 DIAGNOSIS — Z885 Allergy status to narcotic agent status: Secondary | ICD-10-CM | POA: Insufficient documentation

## 2019-08-13 DIAGNOSIS — Z8249 Family history of ischemic heart disease and other diseases of the circulatory system: Secondary | ICD-10-CM | POA: Insufficient documentation

## 2019-08-13 DIAGNOSIS — D120 Benign neoplasm of cecum: Secondary | ICD-10-CM

## 2019-08-13 DIAGNOSIS — I10 Essential (primary) hypertension: Secondary | ICD-10-CM | POA: Insufficient documentation

## 2019-08-13 DIAGNOSIS — K219 Gastro-esophageal reflux disease without esophagitis: Secondary | ICD-10-CM | POA: Insufficient documentation

## 2019-08-13 DIAGNOSIS — E559 Vitamin D deficiency, unspecified: Secondary | ICD-10-CM | POA: Insufficient documentation

## 2019-08-13 DIAGNOSIS — Z6832 Body mass index (BMI) 32.0-32.9, adult: Secondary | ICD-10-CM | POA: Diagnosis not present

## 2019-08-13 DIAGNOSIS — Z87891 Personal history of nicotine dependence: Secondary | ICD-10-CM | POA: Insufficient documentation

## 2019-08-13 DIAGNOSIS — Z9884 Bariatric surgery status: Secondary | ICD-10-CM | POA: Insufficient documentation

## 2019-08-13 DIAGNOSIS — K635 Polyp of colon: Secondary | ICD-10-CM | POA: Diagnosis not present

## 2019-08-13 DIAGNOSIS — Z1211 Encounter for screening for malignant neoplasm of colon: Secondary | ICD-10-CM | POA: Diagnosis not present

## 2019-08-13 HISTORY — PX: COLONOSCOPY WITH PROPOFOL: SHX5780

## 2019-08-13 HISTORY — PX: POLYPECTOMY: SHX5525

## 2019-08-13 SURGERY — COLONOSCOPY WITH PROPOFOL
Anesthesia: General | Site: Rectum

## 2019-08-13 MED ORDER — PROPOFOL 10 MG/ML IV BOLUS
INTRAVENOUS | Status: DC | PRN
  Administered 2019-08-13 (×6): 20 mg via INTRAVENOUS
  Administered 2019-08-13: 80 mg via INTRAVENOUS
  Administered 2019-08-13: 30 mg via INTRAVENOUS
  Administered 2019-08-13: 40 mg via INTRAVENOUS
  Administered 2019-08-13 (×2): 20 mg via INTRAVENOUS
  Administered 2019-08-13: 50 mg via INTRAVENOUS
  Administered 2019-08-13 (×2): 20 mg via INTRAVENOUS

## 2019-08-13 MED ORDER — SODIUM CHLORIDE 0.9 % IV SOLN: INTRAVENOUS | Status: DC

## 2019-08-13 MED ORDER — LACTATED RINGERS IV SOLN: INTRAVENOUS | Status: DC

## 2019-08-13 MED ORDER — STERILE WATER FOR IRRIGATION IR SOLN
Status: DC | PRN
  Administered 2019-08-13: 100 mL

## 2019-08-13 MED ORDER — LIDOCAINE HCL (CARDIAC) PF 100 MG/5ML IV SOSY
PREFILLED_SYRINGE | INTRAVENOUS | Status: DC | PRN
  Administered 2019-08-13: 50 mg via INTRAVENOUS

## 2019-08-13 MED ORDER — LACTATED RINGERS IV SOLN
100.0000 mL/h | INTRAVENOUS | Status: DC
  Administered 2019-08-13: 100 mL/h via INTRAVENOUS

## 2019-08-13 SURGICAL SUPPLY — 8 items
CANISTER SUCT 1200ML W/VALVE (MISCELLANEOUS) ×3 IMPLANT
FORCEPS BIOP RAD 4 LRG CAP 4 (CUTTING FORCEPS) ×3 IMPLANT
GOWN CVR UNV OPN BCK APRN NK (MISCELLANEOUS) ×2 IMPLANT
GOWN ISOL THUMB LOOP REG UNIV (MISCELLANEOUS) ×4
KIT ENDO PROCEDURE OLY (KITS) ×3 IMPLANT
SNARE SHORT THROW 13M SML OVAL (MISCELLANEOUS) ×3 IMPLANT
TRAP ETRAP POLY (MISCELLANEOUS) ×3 IMPLANT
WATER STERILE IRR 250ML POUR (IV SOLUTION) ×3 IMPLANT

## 2019-08-13 NOTE — Anesthesia Procedure Notes (Signed)
Performed by: Natausha Jungwirth, CRNA Pre-anesthesia Checklist: Patient identified, Emergency Drugs available, Suction available, Timeout performed and Patient being monitored Patient Re-evaluated:Patient Re-evaluated prior to induction Oxygen Delivery Method: Nasal cannula Placement Confirmation: positive ETCO2       

## 2019-08-13 NOTE — Transfer of Care (Signed)
Immediate Anesthesia Transfer of Care Note  Patient: Jimmy Bryant  Procedure(s) Performed: COLONOSCOPY WITH BIOPSY (N/A Rectum) POLYPECTOMY (N/A Rectum)  Patient Location: PACU  Anesthesia Type: General  Level of Consciousness: awake, alert  and patient cooperative  Airway and Oxygen Therapy: Patient Spontanous Breathing and Patient connected to supplemental oxygen  Post-op Assessment: Post-op Vital signs reviewed, Patient's Cardiovascular Status Stable, Respiratory Function Stable, Patent Airway and No signs of Nausea or vomiting  Post-op Vital Signs: Reviewed and stable  Complications: No apparent anesthesia complications

## 2019-08-13 NOTE — Anesthesia Preprocedure Evaluation (Signed)
Anesthesia Evaluation  Patient identified by MRN, date of birth, ID band Patient awake    Reviewed: NPO status   History of Anesthesia Complications Negative for: history of anesthetic complications  Airway Mallampati: II  TM Distance: >3 FB Neck ROM: full    Dental  (+) Chipped, Poor Dentition   Pulmonary sleep apnea and Continuous Positive Airway Pressure Ventilation , former smoker,    Pulmonary exam normal        Cardiovascular Exercise Tolerance: Good (-) hypertension (resolved)Normal cardiovascular exam  lipids   Neuro/Psych Anxiety Depression negative neurological ROS     GI/Hepatic Neg liver ROS, GERD  Controlled,S/p gastric bypass 10/2018 > lost 100 lbs.   Endo/Other  neg diabetes (lipids)Morbid obesity (bmi 32)  Renal/GU negative Renal ROS  negative genitourinary   Musculoskeletal   Abdominal   Peds  Hematology negative hematology ROS (+)   Anesthesia Other Findings Covid: NEG.  Reproductive/Obstetrics                             Anesthesia Physical Anesthesia Plan  ASA: II  Anesthesia Plan: General   Post-op Pain Management:    Induction:   PONV Risk Score and Plan: 1 and TIVA and Propofol infusion  Airway Management Planned:   Additional Equipment:   Intra-op Plan:   Post-operative Plan:   Informed Consent: I have reviewed the patients History and Physical, chart, labs and discussed the procedure including the risks, benefits and alternatives for the proposed anesthesia with the patient or authorized representative who has indicated his/her understanding and acceptance.       Plan Discussed with: CRNA  Anesthesia Plan Comments:         Anesthesia Quick Evaluation

## 2019-08-13 NOTE — Op Note (Signed)
[facility] Gastroenterology Patient Name: Jimmy Bryant Procedure Date: 08/13/2019 11:55 AM MRN: [id number] Account #: [id number] Date of Birth: 05-17-1971 Admit Type: Outpatient Age: 48 Room: [facility] OR ROOM 01 Gender: Male Note Status: Finalized Procedure:            Colonoscopy Indications:          Screening for colorectal malignant neoplasm Providers:            Lucilla Lame MD, MD Referring MD:         Bethena Roys. Sowles, MD (Referring MD) Medicines:            Propofol per Anesthesia Complications:        No immediate complications. Procedure:            Pre-Anesthesia Assessment:                       - Prior to the procedure, a History and Physical was                        performed, and patient medications and allergies were                        reviewed. The patient's tolerance of previous                        anesthesia was also reviewed. The risks and benefits of                        the procedure and the sedation options and risks were                        discussed with the patient. All questions were                        answered, and informed consent was obtained. Prior                        Anticoagulants: The patient has taken no previous                        anticoagulant or antiplatelet agents. ASA Grade                        Assessment: II - A patient with mild systemic disease.                        After reviewing the risks and benefits, the patient was                        deemed in satisfactory condition to undergo the                        procedure.                       After obtaining informed consent, the colonoscope was                        passed under direct vision. Throughout the procedure,  the patient's blood pressure, pulse, and oxygen                        saturations were monitored continuously. The was                        introduced through the anus and advanced to the the                      cecum, identified by appendiceal orifice and ileocecal                        valve. The colonoscopy was performed without                        difficulty. The patient tolerated the procedure well.                        The quality of the bowel preparation was good. Findings:      The perianal and digital rectal examinations were normal.      A 6 mm polyp was found in the cecum. The polyp was sessile. The polyp       was removed with a cold biopsy forceps. The polyp was removed with a       cold snare. Resection and retrieval were complete.      A 6 mm polyp was found in the transverse colon. The polyp was sessile.       The polyp was removed with a cold snare. Resection and retrieval were       complete.      A 5 mm polyp was found in the rectum. The polyp was sessile. The polyp       was removed with a cold snare. Resection and retrieval were complete. Impression:           - One 6 mm polyp in the cecum, removed with a cold                        biopsy forceps and removed with a cold snare. Resected                        and retrieved.                       - One 6 mm polyp in the transverse colon, removed with                        a cold snare. Resected and retrieved.                       - One 5 mm polyp in the rectum, removed with a cold                        snare. Resected and retrieved. Recommendation:       - Discharge patient to home.                       - Resume previous diet.                       - Continue present medications.                       -  Await pathology results. Procedure Code(s):    --- Professional ---                       [phone], Colonoscopy, flexible; with removal of tumor(s),                        polyp(s), or other lesion(s) by snare technique Diagnosis Code(s):    --- Professional ---                       Z12.11, Encounter for screening for malignant neoplasm                        of colon                       K63.5,  Polyp of colon                       K62.1, Rectal polyp CPT copyright 2019 American Medical Association. All rights reserved. The codes documented in this report are preliminary and upon coder review may  be revised to meet current compliance requirements. Lucilla Lame MD, MD 08/13/2019 12:27:34 PM This report has been signed electronically. Number of Addenda: 0 Note Initiated On: 08/13/2019 11:55 AM Scope Withdrawal Time: 0 hours 13 minutes 37 seconds  Total Procedure Duration: 0 hours 23 minutes 11 seconds  Estimated Blood Loss: Estimated blood loss: none.      [facility]

## 2019-08-13 NOTE — Anesthesia Postprocedure Evaluation (Signed)
Anesthesia Post Note  Patient: Jimmy Bryant  Procedure(s) Performed: COLONOSCOPY WITH BIOPSY (N/A Rectum) POLYPECTOMY (N/A Rectum)  Patient location during evaluation: PACU Anesthesia Type: General Level of consciousness: awake and alert Pain management: pain level controlled Vital Signs Assessment: post-procedure vital signs reviewed and stable Respiratory status: spontaneous breathing, nonlabored ventilation, respiratory function stable and patient connected to nasal cannula oxygen Cardiovascular status: blood pressure returned to baseline and stable Postop Assessment: no apparent nausea or vomiting Anesthetic complications: no    Phoenicia Pirie

## 2019-08-13 NOTE — H&P (Signed)
Lucilla Lame, MD [facility] [address], [place],  [address] Phone: [phone] Fax : [phone]  Primary Care Physician:  Steele Sizer, MD Primary Gastroenterologist:  Dr. Allen Norris  Pre-Procedure History & Physical: HPI:  Jimmy Bryant is a 48 y.o. male is here for a screening colonoscopy.   Past Medical History:  Diagnosis Date  . Decreased testosterone level   . Depression   . ED (erectile dysfunction)    Urologist in [place]  . Elevated ALT measurement   . GERD (gastroesophageal reflux disease)   . Hyperlipidemia   . Hypertension    White coat  . Morbid obesity ([place])   . Obstructive sleep apnea   . Snoring   . Vitamin D deficiency     Past Surgical History:  Procedure Laterality Date  . microdecompression lumbar spine  02/2011   L4-5 and L5-S1 by Dr. Babette Relic  . UMBILICAL HERNIA REPAIR  07/10/2011    Prior to Admission medications   Medication Sig Start Date End Date Taking? Authorizing Provider  ARIPiprazole (ABILIFY) 5 MG tablet Take 1 tablet (5 mg total) by mouth daily. 07/12/19  Yes Sowles, Drue Stager, MD  atorvastatin (LIPITOR) 40 MG tablet TAKE 1 TABLET(40 MG) BY MOUTH DAILY 06/20/19  Yes Sowles, Drue Stager, MD  Cholecalciferol (VITAMIN D3) 2000 UNITS capsule Take 1 capsule by mouth daily. 03/30/10  Yes [provider]  Cyanocobalamin (B-12) 1000 MCG SUBL Place 1 tablet under the tongue 3 (three) times a week. 07/12/19  Yes Sowles, Drue Stager, MD  Multiple Vitamins-Minerals (MULTIVITAMIN WITH MINERALS) tablet Take 1 tablet by mouth daily.   Yes [provider]  sildenafil (VIAGRA) 100 MG tablet Take 1 tablet (100 mg total) by mouth daily as needed for erectile dysfunction. 07/12/19  Yes Sowles, Drue Stager, MD  venlafaxine XR (EFFEXOR-XR) 150 MG 24 hr capsule TAKE 1 CAPSULE(75MG ) BY MOUTH DAILY WITH BREAKFAST. 06/20/19  Yes Steele Sizer, MD    Allergies as of 07/27/2019 - Review Complete 07/12/2019  Allergen Reaction Noted  .  Oxycodone-acetaminophen Itching 08/18/2018  . Trulicity [dulaglutide] Nausea Only 06/20/2017    Family History  Problem Relation Age of Onset  . Diabetes Mother   . Hypertension Father   . CAD Father   . Hypertension Sister   . Hypertension Daughter   . ADD / ADHD Son     Social History   Socioeconomic History  . Marital status: Married    Spouse name: Claiborne Billings  . Number of children: 1  . Years of education: Not on file  . Highest education level: 8th grade  Occupational History  . Not on file  Social Needs  . Financial resource strain: Somewhat hard  . Food insecurity    Worry: Sometimes true    Inability: Sometimes true  . Transportation needs    Medical: No    Non-medical: No  Tobacco Use  . Smoking status: Former Smoker    Packs/day: 1.00    Years: 31.00    Pack years: 31.00    Types: Cigarettes    Start date: 10/28/1980    Quit date: 06/28/2012    Years since quitting: 7.1  . Smokeless tobacco: Never Used  Substance and Sexual Activity  . Alcohol use: Yes    Alcohol/week: 0.0 standard drinks    Comment: rarely-a beer a month  . Drug use: No  . Sexual activity: Yes    Partners: Female  Lifestyle  . Physical activity    Days per week: 5 days  Minutes per session: 60 min  . Stress: Not at all  Relationships  . Social connections    Talks on phone: More than three times a week    Gets together: Once a week    Attends religious service: Never    Active member of club or organization: No    Attends meetings of clubs or organizations: Never    Relationship status: Married  . Intimate partner violence    Fear of current or ex partner: No    Emotionally abused: No    Physically abused: No    Forced sexual activity: No  Other Topics Concern  . Not on file  Social History Narrative  . Not on file    Review of Systems: See HPI, otherwise negative ROS  Physical Exam: BP 138/85   Pulse 63   Temp 99.1 F (37.3 C) (Temporal)   Ht 5\' 10"  (1.778 m)   Wt  102.5 kg   SpO2 99%   BMI 32.43 kg/m  General:   Alert,  pleasant and cooperative in NAD Head:  Normocephalic and atraumatic. Neck:  Supple; no masses or thyromegaly. Lungs:  Clear throughout to auscultation.    Heart:  Regular rate and rhythm. Abdomen:  Soft, nontender and nondistended. Normal bowel sounds, without guarding, and without rebound.   Neurologic:  Alert and  oriented x4;  grossly normal neurologically.  Impression/Plan: Jimmy Bryant is now here to undergo a screening colonoscopy.  Risks, benefits, and alternatives regarding colonoscopy have been reviewed with the patient.  Questions have been answered.  All parties agreeable.

## 2019-08-17 ENCOUNTER — Encounter: Payer: Self-pay | Admitting: Gastroenterology

## 2019-08-17 LAB — SURGICAL PATHOLOGY

## 2019-09-17 ENCOUNTER — Other Ambulatory Visit: Payer: Self-pay | Admitting: Family Medicine

## 2019-09-17 DIAGNOSIS — F330 Major depressive disorder, recurrent, mild: Secondary | ICD-10-CM

## 2019-09-26 ENCOUNTER — Other Ambulatory Visit: Payer: Self-pay | Admitting: Family Medicine

## 2019-09-26 DIAGNOSIS — F320 Major depressive disorder, single episode, mild: Secondary | ICD-10-CM

## 2019-10-11 ENCOUNTER — Ambulatory Visit: Payer: Managed Care, Other (non HMO) | Admitting: Family Medicine

## 2019-10-11 ENCOUNTER — Encounter: Payer: Self-pay | Admitting: Family Medicine

## 2019-10-11 ENCOUNTER — Other Ambulatory Visit: Payer: Self-pay

## 2019-10-11 VITALS — BP 126/70 | HR 67 | Temp 97.2°F | Resp 16 | Ht 70.0 in | Wt 243.1 lb

## 2019-10-11 DIAGNOSIS — F330 Major depressive disorder, recurrent, mild: Secondary | ICD-10-CM

## 2019-10-11 DIAGNOSIS — E785 Hyperlipidemia, unspecified: Secondary | ICD-10-CM | POA: Diagnosis not present

## 2019-10-11 DIAGNOSIS — E538 Deficiency of other specified B group vitamins: Secondary | ICD-10-CM | POA: Diagnosis not present

## 2019-10-11 DIAGNOSIS — Z79899 Other long term (current) drug therapy: Secondary | ICD-10-CM

## 2019-10-11 DIAGNOSIS — E1169 Type 2 diabetes mellitus with other specified complication: Secondary | ICD-10-CM | POA: Diagnosis not present

## 2019-10-11 DIAGNOSIS — E559 Vitamin D deficiency, unspecified: Secondary | ICD-10-CM

## 2019-10-11 DIAGNOSIS — D539 Nutritional anemia, unspecified: Secondary | ICD-10-CM

## 2019-10-11 DIAGNOSIS — Z9884 Bariatric surgery status: Secondary | ICD-10-CM

## 2019-10-11 DIAGNOSIS — G4733 Obstructive sleep apnea (adult) (pediatric): Secondary | ICD-10-CM

## 2019-10-11 DIAGNOSIS — F320 Major depressive disorder, single episode, mild: Secondary | ICD-10-CM

## 2019-10-11 DIAGNOSIS — F325 Major depressive disorder, single episode, in full remission: Secondary | ICD-10-CM

## 2019-10-11 LAB — POCT GLYCOSYLATED HEMOGLOBIN (HGB A1C): Hemoglobin A1C: 5.2 % (ref 4.0–5.6)

## 2019-10-11 MED ORDER — ATORVASTATIN CALCIUM 40 MG PO TABS: 40.0000 mg | ORAL_TABLET | Freq: Every day | ORAL | 1 refills | Status: DC

## 2019-10-11 MED ORDER — VENLAFAXINE HCL ER 150 MG PO CP24: 150.0000 mg | ORAL_CAPSULE | Freq: Every day | ORAL | 1 refills | Status: DC

## 2019-10-11 MED ORDER — ARIPIPRAZOLE 5 MG PO TABS: 5.0000 mg | ORAL_TABLET | Freq: Every day | ORAL | 1 refills | Status: DC

## 2019-10-11 NOTE — Progress Notes (Signed)
Name: Jimmy Bryant   MRN: [id number]    DOB: [birth year]   Date:10/11/2019       Progress Note  Subjective  Chief Complaint  Chief Complaint  Patient presents with  . Gastroesophageal Reflux  . Diabetes  . Depression  . Hypertension    HPI  Morbid obesity: hehad gastric bypass surgery on 11/09/2018 done [facility] by Dr. Shawna Orleans. He tolerated procedure well, at the day of surgery his weight was 313 lbs and he is down to230's -234 lbs for the past couple of months.  He is on high protein diet, he drinks a protein shake in am's, mostly protein snacks during the day. No dumping symdrome. He is still trying to  go down to 215 lbs -220 lbs , explained for a BMI below 30 his weight needs to be 208 lbs, his BMI is below 35 . Advised to take otc B12 three times a week   Major Depression:he is doing well emotionally, states wife and sister also noticed that his mood is under control, however he had to change the time that he takes medication. Taking Abilify 2 mg two in the am and Effexor 150 mg at night otherwise he cannot sleep. He states he gets 6 solid hours of sleep with this regiment and wakes up feeling well   DM with ED:he has been off medication since bariatric surgery on 11/09/18, off medications, weight has been stable around 230 's lbs . He denies polyphagia, polydipsia or polyuria. ED is controlled with Viagra, he states libido has improved.   HTN:off medication since surgery, bariatric and doing well, bp is at goal, no chest pain or palpitation. We will continue to monitor   GERD: he is off protonix and is doing well. No heartburn or indigestion, he is still eating spicy food but symptoms resolved with weight loss   Hyperlipidemia: he is taking Atorvastatin and denies muscle pain, no chest pain.Still taking medication, last LDL and HDL improved . We will recheck labs today   OSA: using CPAP machine every night, all night, he has one machine for the house and one for his  truck.He has noticed that since his weight loss, he has adjusted pressure from 12 mmH2O to 9 and is doing well, discussed going for titration study but he states he does not have time   Patient Active Problem List   Diagnosis Date Noted  . Encounter for screening colonoscopy   . Benign neoplasm of cecum   . Polyp of transverse colon   . S/P gastric bypass 02/23/2019  . Mild episode of recurrent major depressive disorder ([place]) 12/29/2015  . Dyslipidemia associated with type 2 diabetes mellitus ([place]) 04/30/2015  . Circadian rhythm sleep disorder, shift work type 04/23/2015  . Dyslipidemia 04/23/2015  . H/O lumbosacral spine surgery 04/23/2015  . H/O umbilical hernia repair 123456  . Dysmetabolic syndrome 123456  . Morbid (severe) obesity due to excess calories ([place]) 04/23/2015  . ED (erectile dysfunction) of organic origin 04/23/2015  . Obstructive apnea 04/23/2015  . Vitamin D deficiency 04/23/2015  . Decreased testosterone level 04/23/2015  . Gastro-esophageal reflux disease without esophagitis 08/22/2009    Past Surgical History:  Procedure Laterality Date  . COLONOSCOPY WITH PROPOFOL N/A 08/13/2019   Procedure: COLONOSCOPY WITH BIOPSY;  Surgeon: Lucilla Lame, MD;  Location: [place];  Service: Endoscopy;  Laterality: N/A;  . microdecompression lumbar spine  02/2011   L4-5 and L5-S1 by Dr. Babette Relic  . POLYPECTOMY N/A 08/13/2019  Procedure: POLYPECTOMY;  Surgeon: Lucilla Lame, MD;  Location: [place];  Service: Endoscopy;  Laterality: N/A;  . UMBILICAL HERNIA REPAIR  07/10/2011    Family History  Problem Relation Age of Onset  . Diabetes Mother   . Hypertension Father   . CAD Father   . Hypertension Sister   . Hypertension Daughter   . ADD / ADHD Son     Social History   Socioeconomic History  . Marital status: Married    Spouse name: Claiborne Billings  . Number of children: 1  . Years of education: Not on file  . Highest education level: 8th  grade  Occupational History  . Not on file  Tobacco Use  . Smoking status: Former Smoker    Packs/day: 1.00    Years: 31.00    Pack years: 31.00    Types: Cigarettes    Start date: 10/28/1980    Quit date: 06/28/2012    Years since quitting: 7.2  . Smokeless tobacco: Never Used  Substance and Sexual Activity  . Alcohol use: Yes    Alcohol/week: 0.0 standard drinks    Comment: rarely-a beer a month  . Drug use: No  . Sexual activity: Yes    Partners: Female  Other Topics Concern  . Not on file  Social History Narrative  . Not on file   Social Determinants of Health   Financial Resource Strain:   . Difficulty of Paying Living Expenses: Not on file  Food Insecurity:   . Worried About Charity fundraiser in the Last Year: Not on file  . Ran Out of Food in the Last Year: Not on file  Transportation Needs:   . Lack of Transportation (Medical): Not on file  . Lack of Transportation (Non-Medical): Not on file  Physical Activity:   . Days of Exercise per Week: Not on file  . Minutes of Exercise per Session: Not on file  Stress:   . Feeling of Stress : Not on file  Social Connections:   . Frequency of Communication with Friends and Family: Not on file  . Frequency of Social Gatherings with Friends and Family: Not on file  . Attends Religious Services: Not on file  . Active Member of Clubs or Organizations: Not on file  . Attends Archivist Meetings: Not on file  . Marital Status: Not on file  Intimate Partner Violence:   . Fear of Current or Ex-Partner: Not on file  . Emotionally Abused: Not on file  . Physically Abused: Not on file  . Sexually Abused: Not on file     Current Outpatient Medications:  .  ARIPiprazole (ABILIFY) 5 MG tablet, Take 1 tablet (5 mg total) by mouth daily., Disp: 90 tablet, Rfl: 0 .  atorvastatin (LIPITOR) 40 MG tablet, TAKE 1 TABLET(40 MG) BY MOUTH DAILY, Disp: 90 tablet, Rfl: 1 .  Cholecalciferol (VITAMIN D3) 2000 UNITS capsule, Take 1  capsule by mouth daily., Disp: , Rfl:  .  Cyanocobalamin (B-12) 1000 MCG SUBL, Place 1 tablet under the tongue 3 (three) times a week., Disp: 30 tablet, Rfl: 1 .  Multiple Vitamins-Minerals (MULTIVITAMIN WITH MINERALS) tablet, Take 1 tablet by mouth daily., Disp: , Rfl:  .  sildenafil (VIAGRA) 100 MG tablet, Take 1 tablet (100 mg total) by mouth daily as needed for erectile dysfunction., Disp: 30 tablet, Rfl: 2 .  venlafaxine XR (EFFEXOR-XR) 150 MG 24 hr capsule, TAKE 1 CAPSULE(75MG ) BY MOUTH DAILY WITH BREAKFAST., Disp: 90 capsule, Rfl:  0  Allergies  Allergen Reactions  . Oxycodone-Acetaminophen Itching  . Trulicity [Dulaglutide] Nausea Only    And diarrhea    I personally reviewed active problem list, medication list, allergies, family history, social history, health maintenance with the patient/caregiver today.   ROS  Constitutional: Negative for fever or weight change.  Respiratory: Negative for cough and shortness of breath.   Cardiovascular: Negative for chest pain or palpitations.  Gastrointestinal: Negative for abdominal pain, no bowel changes.  Musculoskeletal: Negative for gait problem or joint swelling.  Skin: Negative for rash.  Neurological: Negative for dizziness or headache.  No other specific complaints in a complete review of systems (except as listed in HPI above).  Objective   Vitals:   10/11/19 0759  BP: 126/70  Pulse: 67  Resp: 16  Temp: (!) 97.2 F (36.2 C)  SpO2: 99%  Weight: 243 lb 1.6 oz (110.3 kg)  Height: 5\' 10"  (1.778 m)    Body mass index is 34.88 kg/m.  Physical Exam  Constitutional: Patient appears well-developed and well-nourished. Obese No distress.  HEENT: head atraumatic, normocephalic, pupils equal and reactive to light Cardiovascular: Normal rate, regular rhythm and normal heart sounds.  No murmur heard. No BLE edema. Pulmonary/Chest: Effort normal and breath sounds normal. No respiratory distress. Abdominal: Soft.  There is no  tenderness. Psychiatric: Patient has a normal mood and affect. behavior is normal. Judgment and thought content normal.  Recent Results (from the past 2160 hour(s))  Cologuard     Status: Abnormal   Collection Time: 07/17/19 12:00 AM  Result Value Ref Range   Cologuard Positive (A) Negative    Comment: See Scanned report  Novel Coronavirus, NAA ([facility])     Status: None   Collection Time: 08/10/19 10:35 AM   Specimen: Oropharyngeal(OP) collection in vial transport medium   OROPHARYNGEA  TESTING  Result Value Ref Range   SARS-CoV-2, NAA Not Detected Not Detected    Comment: This nucleic acid amplification test was developed and its performance characteristics determined by [employer]. Nucleic acid amplification tests include PCR and TMA. This test has not been FDA cleared or approved. This test has been authorized by FDA under an Emergency Use Authorization (EUA). This test is only authorized for the duration of time the declaration that circumstances exist justifying the authorization of the emergency use of in vitro diagnostic tests for detection of SARS-CoV-2 virus and/or diagnosis of COVID-19 infection under section 564(b)(1) of the Act, 21 U.S.C. [id number]) (1), unless the authorization is terminated or revoked sooner. When diagnostic testing is negative, the possibility of a false negative result should be considered in the context of a patient's recent exposures and the presence of clinical signs and symptoms consistent with COVID-19. An individual without symptoms of COVID-19 and who is not shedding SARS-CoV-2 virus would  expect to have a negative (not detected) result in this assay.   Surgical pathology     Status: None   Collection Time: 08/13/19 11:59 AM  Result Value Ref Range   SURGICAL PATHOLOGY      SURGICAL PATHOLOGY CASE: [id number] PATIENT: Kirstie Peri Surgical Pathology Report     Specimen Submitted: A. Colon polyp x1, cecum; cbx  cold snare B. Colon polyp x1, transverse; cold snare C. Rectum polyp x1; cold snare  Clinical History: Screening colonoscopy.  Colon polyps      DIAGNOSIS: A. COLON POLYP, CECUM; COLD BIOPSY AND COLD SNARE: - COLONIC MUCOSA WITH INTRAMUCOSAL LYMPHOID AGGREGATE. - NEGATIVE FOR DYSPLASIA AND MALIGNANCY.  B. COLON POLYP, TRANSVERSE; COLD SNARE: - TUBULAR ADENOMA. - NEGATIVE FOR HIGH-GRADE DYSPLASIA AND MALIGNANCY.  C. RECTUM POLYP; COLD SNARE: - HYPERPLASTIC POLYP. - NEGATIVE FOR DYSPLASIA AND MALIGNANCY.  GROSS DESCRIPTION: A. Labeled: Cecal polyp x1 (per requisition, cold biopsy and cold snare) Received: Formalin Tissue fragment(s): 2 Size: 0.3-0.6 cm Description: Tan soft tissue fragments Entirely submitted in 1 cassette.  B. Labeled: Transverse colon polyp x1 (per requisition, cold snare) Received: Form alin Tissue fragment(s): 1 Size: 0.7 cm Description: Tan soft tissue fragment Entirely submitted in 1 cassette.  C. Labeled: Rectal polyp x1 (per requisition, cold snare) Received: Formalin Tissue fragment(s): Multiple Size: Aggregate, 0.6 x 0.4 x 0.3 cm Description: Tan soft tissue fragment admixed with fecal material Entirely submitted in 1 cassette.   Final Diagnosis performed by Betsy Pries, MD.   Electronically signed 08/17/2019 9:25:52AM The electronic signature indicates that the named Attending Pathologist has evaluated the specimen Technical component performed at [facility], [address], [place], [place] [address] Lab: [id number] Dir: Rush Farmer, MD, MMM  Professional component performed at [facility], [place], [place], [place] [address] Lab: [id number] Dir: Dellia Nims. Rubinas, MD     Diabetic Foot Exam: Diabetic Foot Exam - Simple   Simple Foot Form Diabetic Foot exam was performed with the following findings: Yes 10/11/2019  8:08 AM  Visual Inspection No deformities, no ulcerations, no other skin  breakdown bilaterally: Yes Sensation Testing Intact to touch and monofilament testing bilaterally: Yes Pulse Check Posterior Tibialis and Dorsalis pulse intact bilaterally: Yes Comments      PHQ2/9: Depression screen [facility] 2/9 10/11/2019 10/11/2019 07/12/2019 05/11/2019 04/21/2019  Decreased Interest 0 0 0 0 1  Down, Depressed, Hopeless 0 0 0 0 2  PHQ - 2 Score 0 0 0 0 3  Altered sleeping 0 0 3 3 0  Tired, decreased energy 0 0 1 0 3  Change in appetite 0 0 0 0 0  Feeling bad or failure about yourself  0 0 0 0 0  Trouble concentrating 0 0 0 0 3  Moving slowly or fidgety/restless 0 0 0 0 0  Suicidal thoughts 0 0 0 0 0  PHQ-9 Score 0 0 4 3 9   Difficult doing work/chores Not difficult at all - Somewhat difficult Not difficult at all Not difficult at all  Some recent data might be hidden    phq 9 is negative   Fall Risk: Fall Risk  10/11/2019 10/11/2019 07/12/2019 04/21/2019 12/17/2018  Falls in the past year? 0 0 0 0 0  Number falls in past yr: 0 0 0 0 0  Injury with Fall? 0 0 0 0 0  Comment - - - - -  Follow up - - - - -     Functional Status Survey: Is the patient deaf or have difficulty hearing?: No Does the patient have difficulty seeing, even when wearing glasses/contacts?: No Does the patient have difficulty concentrating, remembering, or making decisions?: No Does the patient have difficulty walking or climbing stairs?: No Does the patient have difficulty dressing or bathing?: No Does the patient have difficulty doing errands alone such as visiting a doctor's office or shopping?: No   Assessment & Plan  1. Dyslipidemia associated with type 2 diabetes mellitus (HCC)  - POCT HgB A1C - Urine Microalbumin w/creat. ratio - Lipid panel  2. Obstructive apnea  He is down to 9 cmH2O from 12  3. B12 deficiency  - Vitamin B12  4. Vitamin D  deficiency  - VITAMIN D 25 Hydroxy (Vit-D Deficiency, Fractures)  5. History of bariatric surgery  - Iron, TIBC and Ferritin  Panel - CBC with Differential/Platelet  6. Long-term use of high-risk medication  - Comprehensive metabolic panel  7. Major depression in remission (HCC)  - ARIPiprazole (ABILIFY) 5 MG tablet; Take 1 tablet (5 mg total) by mouth daily.  Dispense: 90 tablet; Refill: 1 - venlafaxine XR (EFFEXOR-XR) 150 MG 24 hr capsule; Take 1 capsule (150 mg total) by mouth daily.  Dispense: 90 capsule; Refill: 1   8. Anemia associated with nutritional deficiency  - Iron, TIBC and Ferritin Panel - CBC with Differential/Platelet  9. Dyslipidemia  - atorvastatin (LIPITOR) 40 MG tablet; Take 1 tablet (40 mg total) by mouth daily.  Dispense: 90 tablet; Refill: 1

## 2019-10-12 LAB — CBC WITH DIFFERENTIAL/PLATELET
Basophils Absolute: 0.1 10*3/uL (ref 0.0–0.2)
Basos: 1 %
EOS (ABSOLUTE): 0.2 10*3/uL (ref 0.0–0.4)
Eos: 3 %
Hematocrit: 41.2 % (ref 37.5–51.0)
Hemoglobin: 13.5 g/dL (ref 13.0–17.7)
Immature Grans (Abs): 0 10*3/uL (ref 0.0–0.1)
Immature Granulocytes: 0 %
Lymphocytes Absolute: 2.2 10*3/uL (ref 0.7–3.1)
Lymphs: 37 %
MCH: 27.9 pg (ref 26.6–33.0)
MCHC: 32.8 g/dL (ref 31.5–35.7)
MCV: 85 fL (ref 79–97)
Monocytes Absolute: 0.5 10*3/uL (ref 0.1–0.9)
Monocytes: 8 %
Neutrophils Absolute: 3.1 10*3/uL (ref 1.4–7.0)
Neutrophils: 51 %
Platelets: 280 10*3/uL (ref 150–450)
RBC: 4.84 x10E6/uL (ref 4.14–5.80)
RDW: 13.5 % (ref 11.6–15.4)
WBC: 6 10*3/uL (ref 3.4–10.8)

## 2019-10-12 LAB — COMPREHENSIVE METABOLIC PANEL
ALT: 41 IU/L (ref 0–44)
AST: 26 IU/L (ref 0–40)
Albumin/Globulin Ratio: 1.6 (ref 1.2–2.2)
Albumin: 4.4 g/dL (ref 4.0–5.0)
Alkaline Phosphatase: 90 IU/L (ref 39–117)
BUN/Creatinine Ratio: 17 (ref 9–20)
BUN: 14 mg/dL (ref 6–24)
Bilirubin Total: 0.6 mg/dL (ref 0.0–1.2)
CO2: 27 mmol/L (ref 20–29)
Calcium: 9.6 mg/dL (ref 8.7–10.2)
Chloride: 99 mmol/L (ref 96–106)
Creatinine, Ser: 0.83 mg/dL (ref 0.76–1.27)
GFR calc Af Amer: 119 mL/min/{1.73_m2} (ref >59)
GFR calc non Af Amer: 103 mL/min/{1.73_m2} (ref >59)
Globulin, Total: 2.7 g/dL (ref 1.5–4.5)
Glucose: 79 mg/dL (ref 65–99)
Potassium: 4.4 mmol/L (ref 3.5–5.2)
Sodium: 139 mmol/L (ref 134–144)
Total Protein: 7.1 g/dL (ref 6.0–8.5)

## 2019-10-12 LAB — LIPID PANEL
Chol/HDL Ratio: 2.6 ratio (ref 0.0–5.0)
Cholesterol, Total: 113 mg/dL (ref 100–199)
HDL: 44 mg/dL (ref >39)
LDL Chol Calc (NIH): 56 mg/dL (ref 0–99)
Triglycerides: 60 mg/dL (ref 0–149)
VLDL Cholesterol Cal: 13 mg/dL (ref 5–40)

## 2019-10-12 LAB — MICROALBUMIN / CREATININE URINE RATIO
Creatinine, Urine: 104 mg/dL (ref 20–320)
Microalb Creat Ratio: 4 mcg/mg creat (ref <30)
Microalb, Ur: 0.4 mg/dL

## 2019-10-12 LAB — IRON,TIBC AND FERRITIN PANEL
Ferritin: 113 ng/mL (ref 30–400)
Iron Saturation: 23 % (ref 15–55)
Iron: 65 ug/dL (ref 38–169)
Total Iron Binding Capacity: 278 ug/dL (ref 250–450)
UIBC: 213 ug/dL (ref 111–343)

## 2019-10-12 LAB — VITAMIN D 25 HYDROXY (VIT D DEFICIENCY, FRACTURES): Vit D, 25-Hydroxy: 36.6 ng/mL (ref 30.0–100.0)

## 2019-10-12 LAB — VITAMIN B12: Vitamin B-12: 398 pg/mL (ref 232–1245)

## 2020-02-07 ENCOUNTER — Encounter: Payer: Self-pay | Admitting: Family Medicine

## 2020-02-11 ENCOUNTER — Encounter: Payer: Self-pay | Admitting: Family Medicine

## 2020-02-11 ENCOUNTER — Ambulatory Visit: Payer: Managed Care, Other (non HMO) | Admitting: Family Medicine

## 2020-02-11 ENCOUNTER — Other Ambulatory Visit: Payer: Self-pay

## 2020-02-11 VITALS — BP 158/90 | HR 88 | Temp 97.7°F | Resp 16 | Ht 70.0 in | Wt 246.9 lb

## 2020-02-11 DIAGNOSIS — E538 Deficiency of other specified B group vitamins: Secondary | ICD-10-CM | POA: Diagnosis not present

## 2020-02-11 DIAGNOSIS — G4733 Obstructive sleep apnea (adult) (pediatric): Secondary | ICD-10-CM | POA: Diagnosis not present

## 2020-02-11 DIAGNOSIS — E1169 Type 2 diabetes mellitus with other specified complication: Secondary | ICD-10-CM | POA: Diagnosis not present

## 2020-02-11 DIAGNOSIS — F325 Major depressive disorder, single episode, in full remission: Secondary | ICD-10-CM

## 2020-02-11 DIAGNOSIS — E1140 Type 2 diabetes mellitus with diabetic neuropathy, unspecified: Secondary | ICD-10-CM | POA: Diagnosis not present

## 2020-02-11 DIAGNOSIS — E785 Hyperlipidemia, unspecified: Secondary | ICD-10-CM

## 2020-02-11 DIAGNOSIS — K219 Gastro-esophageal reflux disease without esophagitis: Secondary | ICD-10-CM

## 2020-02-11 DIAGNOSIS — I10 Essential (primary) hypertension: Secondary | ICD-10-CM

## 2020-02-11 DIAGNOSIS — E559 Vitamin D deficiency, unspecified: Secondary | ICD-10-CM | POA: Diagnosis not present

## 2020-02-11 DIAGNOSIS — N529 Male erectile dysfunction, unspecified: Secondary | ICD-10-CM

## 2020-02-11 LAB — POCT GLYCOSYLATED HEMOGLOBIN (HGB A1C): HbA1c, POC (controlled diabetic range): 5 % (ref 0.0–7.0)

## 2020-02-11 MED ORDER — VENLAFAXINE HCL ER 150 MG PO CP24: 150.0000 mg | ORAL_CAPSULE | Freq: Every day | ORAL | 1 refills | Status: DC

## 2020-02-11 MED ORDER — ARIPIPRAZOLE 5 MG PO TABS: 5.0000 mg | ORAL_TABLET | Freq: Every day | ORAL | 1 refills | Status: DC

## 2020-02-11 MED ORDER — ATORVASTATIN CALCIUM 40 MG PO TABS: 40.0000 mg | ORAL_TABLET | Freq: Every day | ORAL | 1 refills | Status: DC

## 2020-02-11 MED ORDER — PREGABALIN 50 MG PO CAPS: 50.0000 mg | ORAL_CAPSULE | Freq: Three times a day (TID) | ORAL | 0 refills | Status: DC

## 2020-02-11 MED ORDER — SILDENAFIL CITRATE 100 MG PO TABS: 100.0000 mg | ORAL_TABLET | Freq: Every day | ORAL | 2 refills | Status: DC | PRN

## 2020-02-11 NOTE — Patient Instructions (Signed)
We will start Lyrica to see how you legs feel. Take one at night for the first couple of nights, you can try taking during the day when you off work - it may cause sedation. You can go up on the evening dose to 3 at night and let me know if works in a couple of weeks  bp is high, return next week for CMA visit - bp check only if still high we will start diovan with or without hctz  Goggle : egg muffins and also Fat bomb - without added sugar

## 2020-02-11 NOTE — Progress Notes (Signed)
Name: Jimmy Bryant   MRN: [id number]    DOB: [birth year]   Date:02/11/2020       Progress Note  Subjective  Chief Complaint  Chief Complaint  Patient presents with  . Leg Pain    Onset-1 year- but steady hurting the past couple of months. Worst at night when he lays down-feels like the pain is in his bone-of his shins-pulling sensation,achy, burning.     HPI   Morbid obesity: hehad gastric bypass surgery on 11/09/2018 done at [facility] by Dr. Shawna Orleans. He tolerated procedure well, at the day of surgery his weight was 313 lbs and was stable on 230's -234 lbs for months however gaining weight again.  He is eating the right things most of the time however snacking more often. No dumping symdrome. His weight needs to be 208 lbs, his BMI is below 35 . Advised to take otc B12 three times a week , reviewed labs with him today   Major Depression:he is doing well emotionally, states wife and sister also noticed that his mood is under control, however he had to change the time that he takes medication. He states he has been able to sleep 6 hours straight, he states Abilify keeps him up, advised to take it in am with Effexor and monitor   DM with ED:he has been off medication since bariatric surgery on 11/09/18, weight is trending up, it was around 130's for a while today is above 150 lbs He denies polyphagia, polydipsia or polyuria. ED is controlled with Viagra, he has noticed deep pain on his shins over the past year but worse over the past couple of months, pain is worse at night, he needs to keep moving his legs at night. Discussed RLS, he states it is constant pain just worse at night. We will treat with Lyrica since can help with RLS and neuropathy   HTN:off medication since surgery, bariatric and was doing well, no chest pain or palpitation.He has not checked his bp in about one month. He also states he has gained weight, snacking more often, and not sleeping well. He does not want to resume  medication today but will return for bp check next week.   GERD: he states he had to take one Pantoprazole last night because he ate hamburger with peppers and onions on spinach wrap. He states first time he took medication since bariatric surgery   Hyperlipidemia: he stopped taking statin about 2 weeks ago, he thought could have stopped because his level was good, advised to go back on medication   OSA: using CPAP machine every night, all night, he has one machine for the house and one for his truck.He has noticed that since his weight loss, he has adjusted pressure from 12 mmH2O to 9 same as last visit. He is tolerating it well.    Patient Active Problem List   Diagnosis Date Noted  . Encounter for screening colonoscopy   . Benign neoplasm of cecum   . Polyp of transverse colon   . S/P gastric bypass 02/23/2019  . Mild episode of recurrent major depressive disorder ([place]) 12/29/2015  . Dyslipidemia associated with type 2 diabetes mellitus ([place]) 04/30/2015  . Circadian rhythm sleep disorder, shift work type 04/23/2015  . Dyslipidemia 04/23/2015  . H/O lumbosacral spine surgery 04/23/2015  . H/O umbilical hernia repair 123456  . Dysmetabolic syndrome 123456  . Morbid (severe) obesity due to excess calories ([place]) 04/23/2015  . ED (erectile dysfunction) of organic origin 04/23/2015  .  Obstructive apnea 04/23/2015  . Vitamin D deficiency 04/23/2015  . Decreased testosterone level 04/23/2015  . Gastro-esophageal reflux disease without esophagitis 08/22/2009    Past Surgical History:  Procedure Laterality Date  . COLONOSCOPY WITH PROPOFOL N/A 08/13/2019   Procedure: COLONOSCOPY WITH BIOPSY;  Surgeon: Lucilla Lame, MD;  Location: [place];  Service: Endoscopy;  Laterality: N/A;  . microdecompression lumbar spine  02/2011   L4-5 and L5-S1 by Dr. Babette Relic  . POLYPECTOMY N/A 08/13/2019   Procedure: POLYPECTOMY;  Surgeon: Lucilla Lame, MD;  Location: [place];  Service: Endoscopy;  Laterality: N/A;  . UMBILICAL HERNIA REPAIR  07/10/2011    Family History  Problem Relation Age of Onset  . Diabetes Mother   . Hypertension Father   . CAD Father   . Hypertension Sister   . Hypertension Daughter   . ADD / ADHD Son     Social History   Tobacco Use  . Smoking status: Former Smoker    Packs/day: 1.00    Years: 31.00    Pack years: 31.00    Types: Cigarettes    Start date: 10/28/1980    Quit date: 06/28/2012    Years since quitting: 7.6  . Smokeless tobacco: Never Used  Substance Use Topics  . Alcohol use: Yes    Alcohol/week: 0.0 standard drinks    Comment: rarely-a beer a month     Current Outpatient Medications:  .  ARIPiprazole (ABILIFY) 5 MG tablet, Take 1 tablet (5 mg total) by mouth daily., Disp: 90 tablet, Rfl: 1 .  atorvastatin (LIPITOR) 40 MG tablet, Take 1 tablet (40 mg total) by mouth daily., Disp: 90 tablet, Rfl: 1 .  Cholecalciferol (VITAMIN D3) 2000 UNITS capsule, Take 1 capsule by mouth daily., Disp: , Rfl:  .  Cyanocobalamin (B-12) 1000 MCG SUBL, Place 1 tablet under the tongue 3 (three) times a week., Disp: 30 tablet, Rfl: 1 .  Multiple Vitamins-Minerals (MULTIVITAMIN WITH MINERALS) tablet, Take 1 tablet by mouth daily., Disp: , Rfl:  .  sildenafil (VIAGRA) 100 MG tablet, Take 1 tablet (100 mg total) by mouth daily as needed for erectile dysfunction., Disp: 30 tablet, Rfl: 2 .  venlafaxine XR (EFFEXOR-XR) 150 MG 24 hr capsule, Take 1 capsule (150 mg total) by mouth daily., Disp: 90 capsule, Rfl: 1 .  pregabalin (LYRICA) 50 MG capsule, Take 1 capsule (50 mg total) by mouth 3 (three) times daily. Start at night and go up on dose or take TID, Disp: 90 capsule, Rfl: 0  Allergies  Allergen Reactions  . Oxycodone-Acetaminophen Itching  . Trulicity [Dulaglutide] Nausea Only    And diarrhea    I personally reviewed active problem list, medication list, allergies, family history, social history, health maintenance with  the patient/caregiver today.   ROS  Constitutional: Negative for fever, positive for  weight change.  Respiratory: Negative for cough and shortness of breath.   Cardiovascular: Negative for chest pain or palpitations.  Gastrointestinal: Negative for abdominal pain, no bowel changes.  Musculoskeletal: Negative for gait problem or joint swelling.  Skin: Negative for rash.  Neurological: Negative for dizziness or headache.  No other specific complaints in a complete review of systems (except as listed in HPI above).  Objective  Vitals:   02/11/20 1445 02/11/20 1457  BP: (!) 168/90 (!) 160/90  Pulse: 88   Resp: 16   Temp: 97.7 F (36.5 C)   TempSrc: Temporal   SpO2: 98%   Weight: 246 lb 14.4 oz (112 kg)  Height: 5\' 10"  (1.778 m)     Body mass index is 35.43 kg/m.  Physical Exam  Constitutional: Patient appears well-developed and well-nourished. Obese  No distress.  HEENT: head atraumatic, normocephalic, pupils equal and reactive to light,, neck supple, throat within normal limits Cardiovascular: Normal rate, regular rhythm and normal heart sounds.  No murmur heard. No BLE edema. Pulmonary/Chest: Effort normal and breath sounds normal. No respiratory distress. Abdominal: Soft.  There is no tenderness. Psychiatric: Patient has a normal mood and affect. behavior is normal. Judgment and thought content normal.  PHQ2/9: Depression screen [facility] 2/9 02/11/2020 10/11/2019 10/11/2019 07/12/2019 05/11/2019  Decreased Interest 0 0 0 0 0  Down, Depressed, Hopeless 0 0 0 0 0  PHQ - 2 Score 0 0 0 0 0  Altered sleeping 3 0 0 3 3  Tired, decreased energy 1 0 0 1 0  Change in appetite 1 0 0 0 0  Feeling bad or failure about yourself  0 0 0 0 0  Trouble concentrating 0 0 0 0 0  Moving slowly or fidgety/restless 0 0 0 0 0  Suicidal thoughts 0 0 0 0 0  PHQ-9 Score 5 0 0 4 3  Difficult doing work/chores Somewhat difficult Not difficult at all - Somewhat difficult Not difficult at all  Some  recent data might be hidden    phq 9 is negative   Fall Risk: Fall Risk  02/11/2020 10/11/2019 10/11/2019 07/12/2019 04/21/2019  Falls in the past year? 0 0 0 0 0  Number falls in past yr: 0 0 0 0 0  Injury with Fall? 0 0 0 0 0  Comment - - - - -  Follow up - - - - -     Functional Status Survey: Is the patient deaf or have difficulty hearing?: No Does the patient have difficulty seeing, even when wearing glasses/contacts?: No Does the patient have difficulty concentrating, remembering, or making decisions?: No Does the patient have difficulty walking or climbing stairs?: No Does the patient have difficulty dressing or bathing?: No Does the patient have difficulty doing errands alone such as visiting a doctor's office or shopping?: No    Assessment & Plan  1. Dyslipidemia associated with type 2 diabetes mellitus (HCC)  - POCT glycosylated hemoglobin (Hb A1C)  2. Vitamin D deficiency   3. B12 deficiency   4. Obstructive apnea  Continue CPAP   5. Major depression in remission ([place])  Take Abilify in am since unable to sleep well at night when he takes it before bed, but unable to stop because he gets moody without medication  - venlafaxine XR (EFFEXOR-XR) 150 MG 24 hr capsule; Take 1 capsule (150 mg total) by mouth daily.  Dispense: 90 capsule; Refill: 1 - ARIPiprazole (ABILIFY) 5 MG tablet; Take 1 tablet (5 mg total) by mouth daily.  Dispense: 90 tablet; Refill: 1  6. ED (erectile dysfunction) of organic origin  Continue viagra   7. Hypertension, benign  BP is up again, discussed cutting down on salt intake   8. Gastroesophageal reflux disease without esophagitis  Needs to avoid spicy food   9. Controlled type 2 diabetes with neuropathy (HCC)  - POCT glycosylated hemoglobin (Hb A1C) - pregabalin (LYRICA) 50 MG capsule; Take 1 capsule (50 mg total) by mouth 3 (three) times daily. Start at night and go up on dose or take TID  Dispense: 90 capsule; Refill:  0  10. Dyslipidemia  - atorvastatin (LIPITOR) 40 MG tablet; Take 1 tablet (  40 mg total) by mouth daily.  Dispense: 90 tablet; Refill: 1

## 2020-02-12 ENCOUNTER — Encounter: Payer: Self-pay | Admitting: Family Medicine

## 2020-02-17 ENCOUNTER — Encounter: Payer: Self-pay | Admitting: Family Medicine

## 2020-02-18 ENCOUNTER — Other Ambulatory Visit: Payer: Self-pay

## 2020-02-18 ENCOUNTER — Ambulatory Visit: Payer: Managed Care, Other (non HMO)

## 2020-02-18 VITALS — BP 136/86 | HR 85 | Temp 97.9°F

## 2020-02-18 DIAGNOSIS — I10 Essential (primary) hypertension: Secondary | ICD-10-CM

## 2020-02-18 NOTE — Progress Notes (Signed)
Patient is here for a blood pressure check. Patient denies chest pain, palpitations, shortness of breath or visual disturbances. At previous visit blood pressure was 158/90 with a heart rate of 5'10. Today during nurse visit first check blood pressure was 136/86.Marland Kitchen He does not take any blood pressure medications.

## 2020-04-09 ENCOUNTER — Encounter: Payer: Self-pay | Admitting: Family Medicine

## 2020-04-10 ENCOUNTER — Encounter: Payer: Managed Care, Other (non HMO) | Admitting: Family Medicine

## 2020-08-18 ENCOUNTER — Ambulatory Visit: Payer: Managed Care, Other (non HMO) | Admitting: Family Medicine

## 2020-08-18 ENCOUNTER — Encounter: Payer: Self-pay | Admitting: Family Medicine

## 2020-08-18 ENCOUNTER — Other Ambulatory Visit: Payer: Self-pay

## 2020-08-18 VITALS — BP 128/76 | HR 76 | Temp 98.3°F | Resp 18 | Ht 70.0 in | Wt 267.0 lb

## 2020-08-18 DIAGNOSIS — N529 Male erectile dysfunction, unspecified: Secondary | ICD-10-CM

## 2020-08-18 DIAGNOSIS — E1140 Type 2 diabetes mellitus with diabetic neuropathy, unspecified: Secondary | ICD-10-CM | POA: Diagnosis not present

## 2020-08-18 DIAGNOSIS — E538 Deficiency of other specified B group vitamins: Secondary | ICD-10-CM

## 2020-08-18 DIAGNOSIS — Z23 Encounter for immunization: Secondary | ICD-10-CM | POA: Diagnosis not present

## 2020-08-18 DIAGNOSIS — F325 Major depressive disorder, single episode, in full remission: Secondary | ICD-10-CM

## 2020-08-18 DIAGNOSIS — E1169 Type 2 diabetes mellitus with other specified complication: Secondary | ICD-10-CM

## 2020-08-18 DIAGNOSIS — E559 Vitamin D deficiency, unspecified: Secondary | ICD-10-CM

## 2020-08-18 DIAGNOSIS — G4733 Obstructive sleep apnea (adult) (pediatric): Secondary | ICD-10-CM

## 2020-08-18 DIAGNOSIS — E785 Hyperlipidemia, unspecified: Secondary | ICD-10-CM

## 2020-08-18 DIAGNOSIS — K219 Gastro-esophageal reflux disease without esophagitis: Secondary | ICD-10-CM

## 2020-08-18 LAB — POCT GLYCOSYLATED HEMOGLOBIN (HGB A1C): Hemoglobin A1C: 5.3 % (ref 4.0–5.6)

## 2020-08-18 MED ORDER — VENLAFAXINE HCL ER 150 MG PO CP24: 150.0000 mg | ORAL_CAPSULE | Freq: Every day | ORAL | 1 refills | Status: DC

## 2020-08-18 MED ORDER — ATORVASTATIN CALCIUM 40 MG PO TABS: 40.0000 mg | ORAL_TABLET | Freq: Every day | ORAL | 1 refills | Status: DC

## 2020-08-18 MED ORDER — ARIPIPRAZOLE 5 MG PO TABS: 5.0000 mg | ORAL_TABLET | Freq: Every day | ORAL | 1 refills | Status: DC

## 2020-08-18 MED ORDER — PREGABALIN 50 MG PO CAPS: 50.0000 mg | ORAL_CAPSULE | Freq: Three times a day (TID) | ORAL | 1 refills | Status: DC

## 2020-08-18 NOTE — Progress Notes (Signed)
Name: Jimmy Bryant   MRN: [id number]    DOB: June 07, 1971   Date:08/18/2020       Progress Note  Subjective  Chief Complaint  Follow up   HPI   Morbid obesity: hehad gastric bypass surgery on 11/09/2018 done at [place] by Dr. Shawna Orleans. He tolerated procedure well, at the day of surgery his weight was 313 lbs he was stable 230's -234 lbs for months however since last visit he has gained almost 20 lbs. Travelling more again through his job and eating out more often. No dumping symdrome. His weight needs to be 208 lbs, his BMI is below 35 . Advised to take otc B12 three times a week . He tries to eat well, but snacking very often   Major Depression:he is doing well emotionally, states wife and sister also noticed that his mood is under control, however he had to change the time that he takes medication. He states he has been able to sleep 6 hours straight, he is taking Abilfy and Effexor in am's and is sleeping better   DM with ED:he has been off medication since bariatric surgery on 11/09/18, weight is going up, but glucose still under control at home, and today A1C still normal. He denies polyphagia, polydipsia or polyuria. ED is controlled with Viagra, he states lower extremity pain at night resolved with Lyrica, but now only taking it prn   HTN:off medication since surgery, bariatric and was doing well, no chest pain or palpitation.BP during his last visit was elevated but he was in pain, since then bp has been normal again   GERD: he is doing well, no longer having problems  Hyperlipidemia: he is back on statin therapy and we will recheck it during his physical exam   OSA: using CPAP machine every night, all night, he has one machine for the house and one for his truck.He has noticed that since his weight loss,pressure still at 9 . He is tolerating it well.    Patient Active Problem List   Diagnosis Date Noted  . Encounter for screening colonoscopy   . Benign neoplasm of cecum    . Polyp of transverse colon   . S/P gastric bypass 02/23/2019  . Mild episode of recurrent major depressive disorder ([place]) 12/29/2015  . Dyslipidemia associated with type 2 diabetes mellitus ([place]) 04/30/2015  . Circadian rhythm sleep disorder, shift work type 04/23/2015  . Dyslipidemia 04/23/2015  . H/O lumbosacral spine surgery 04/23/2015  . H/O umbilical hernia repair 03/55/9741  . Dysmetabolic syndrome 63/84/5364  . Morbid (severe) obesity due to excess calories ([place]) 04/23/2015  . ED (erectile dysfunction) of organic origin 04/23/2015  . Obstructive apnea 04/23/2015  . Vitamin D deficiency 04/23/2015  . Decreased testosterone level 04/23/2015  . Gastro-esophageal reflux disease without esophagitis 08/22/2009    Past Surgical History:  Procedure Laterality Date  . COLONOSCOPY WITH PROPOFOL N/A 08/13/2019   Procedure: COLONOSCOPY WITH BIOPSY;  Surgeon: Lucilla Lame, MD;  Location: [place];  Service: Endoscopy;  Laterality: N/A;  . microdecompression lumbar spine  02/2011   L4-5 and L5-S1 by Dr. Babette Relic  . POLYPECTOMY N/A 08/13/2019   Procedure: POLYPECTOMY;  Surgeon: Lucilla Lame, MD;  Location: [place];  Service: Endoscopy;  Laterality: N/A;  . UMBILICAL HERNIA REPAIR  07/10/2011    Family History  Problem Relation Age of Onset  . Diabetes Mother   . Hypertension Father   . CAD Father   . Hypertension Sister   . Hypertension  Daughter   . ADD / ADHD Son     Social History   Tobacco Use  . Smoking status: Former Smoker    Packs/day: 1.00    Years: 31.00    Pack years: 31.00    Types: Cigarettes    Start date: 10/28/1980    Quit date: 06/28/2012    Years since quitting: 8.1  . Smokeless tobacco: Never Used  Substance Use Topics  . Alcohol use: Yes    Alcohol/week: 0.0 standard drinks    Comment: rarely-a beer a month     Current Outpatient Medications:  .  ARIPiprazole (ABILIFY) 5 MG tablet, Take 1 tablet (5 mg total) by mouth daily.,  Disp: 90 tablet, Rfl: 1 .  atorvastatin (LIPITOR) 40 MG tablet, Take 1 tablet (40 mg total) by mouth daily., Disp: 90 tablet, Rfl: 1 .  Cholecalciferol (VITAMIN D3) 2000 UNITS capsule, Take 1 capsule by mouth daily., Disp: , Rfl:  .  Cyanocobalamin (B-12) 1000 MCG SUBL, Place 1 tablet under the tongue 3 (three) times a week., Disp: 30 tablet, Rfl: 1 .  Multiple Vitamins-Minerals (MULTIVITAMIN WITH MINERALS) tablet, Take 1 tablet by mouth daily., Disp: , Rfl:  .  pregabalin (LYRICA) 50 MG capsule, Take 1 capsule (50 mg total) by mouth 3 (three) times daily. Start at night and go up on dose or take TID, Disp: 90 capsule, Rfl: 0 .  sildenafil (VIAGRA) 100 MG tablet, Take 1 tablet (100 mg total) by mouth daily as needed for erectile dysfunction., Disp: 30 tablet, Rfl: 2 .  venlafaxine XR (EFFEXOR-XR) 150 MG 24 hr capsule, Take 1 capsule (150 mg total) by mouth daily., Disp: 90 capsule, Rfl: 1  Allergies  Allergen Reactions  . Oxycodone-Acetaminophen Itching  . Trulicity [Dulaglutide] Nausea Only    And diarrhea    I personally reviewed active problem list, medication list, allergies, family history, social history, health maintenance with the patient/caregiver today.   ROS  Constitutional: Negative for fever, positive for  weight change.  Respiratory: Negative for cough and shortness of breath.   Cardiovascular: Negative for chest pain or palpitations.  Gastrointestinal: Negative for abdominal pain, no bowel changes.  Musculoskeletal: Negative for gait problem or joint swelling.  Skin: Negative for rash.  Neurological: Negative for dizziness or headache.  No other specific complaints in a complete review of systems (except as listed in HPI above).  Objective  Vitals:   08/18/20 1507  BP: 128/76  Pulse: 76  Resp: 18  Temp: 98.3 F (36.8 C)  TempSrc: Oral  SpO2: 100%  Weight: 267 lb (121.1 kg)  Height: 5\' 10"  (1.778 m)    Body mass index is 38.31 kg/m.  Physical  Exam  Constitutional: Patient appears well-developed and well-nourished. Obese  No distress.  HEENT: head atraumatic, normocephalic, pupils equal and reactive to light, eneck supple Cardiovascular: Normal rate, regular rhythm and normal heart sounds.  No murmur heard. No BLE edema. Pulmonary/Chest: Effort normal and breath sounds normal. No respiratory distress. Abdominal: Soft.  There is no tenderness. Psychiatric: Patient has a normal mood and affect. behavior is normal. Judgment and thought content normal.  Recent Results (from the past 2160 hour(s))  POCT HgB A1C     Status: None   Collection Time: 08/18/20  3:19 PM  Result Value Ref Range   Hemoglobin A1C 5.3 4.0 - 5.6 %   HbA1c POC (<> result, manual entry)     HbA1c, POC (prediabetic range)     HbA1c, POC (controlled diabetic  range)        PHQ2/9: Depression screen [facility] 2/9 08/18/2020 02/11/2020 10/11/2019 10/11/2019 07/12/2019  Decreased Interest 0 0 0 0 0  Down, Depressed, Hopeless 0 0 0 0 0  PHQ - 2 Score 0 0 0 0 0  Altered sleeping - 3 0 0 3  Tired, decreased energy - 1 0 0 1  Change in appetite - 1 0 0 0  Feeling bad or failure about yourself  - 0 0 0 0  Trouble concentrating - 0 0 0 0  Moving slowly or fidgety/restless - 0 0 0 0  Suicidal thoughts - 0 0 0 0  PHQ-9 Score - 5 0 0 4  Difficult doing work/chores - Somewhat difficult Not difficult at all - Somewhat difficult  Some recent data might be hidden    phq 9 is negative   Fall Risk: Fall Risk  08/18/2020 02/11/2020 10/11/2019 10/11/2019 07/12/2019  Falls in the past year? 0 0 0 0 0  Number falls in past yr: 0 0 0 0 0  Injury with Fall? 0 0 0 0 0  Comment - - - - -  Follow up Falls evaluation completed - - - -    Functional Status Survey: Is the patient deaf or have difficulty hearing?: No Does the patient have difficulty seeing, even when wearing glasses/contacts?: No Does the patient have difficulty concentrating, remembering, or making decisions?:  No Does the patient have difficulty walking or climbing stairs?: No Does the patient have difficulty dressing or bathing?: No Does the patient have difficulty doing errands alone such as visiting a doctor's office or shopping?: No    Assessment & Plan  1. Controlled type 2 diabetes with neuropathy (HCC)  - POCT HgB A1C - pregabalin (LYRICA) 50 MG capsule; Take 1 capsule (50 mg total) by mouth 3 (three) times daily. Start at night and go up on dose or take TID  Dispense: 90 capsule; Refill: 1  2. Need for influenza vaccination  - Flu Vaccine QUAD 6+ mos PF IM (Fluarix Quad PF)  3. B12 deficiency   4. Dyslipidemia associated with type 2 diabetes mellitus ([place])   5. Vitamin D deficiency   6. Major depression in remission (HCC)  - ARIPiprazole (ABILIFY) 5 MG tablet; Take 1 tablet (5 mg total) by mouth daily.  Dispense: 90 tablet; Refill: 1 - venlafaxine XR (EFFEXOR-XR) 150 MG 24 hr capsule; Take 1 capsule (150 mg total) by mouth daily.  Dispense: 90 capsule; Refill: 1  7. Gastroesophageal reflux disease without esophagitis   8. Obstructive apnea   9. Dyslipidemia  - atorvastatin (LIPITOR) 40 MG tablet; Take 1 tablet (40 mg total) by mouth daily.  Dispense: 90 tablet; Refill: 1  10. ED (erectile dysfunction) of organic origin

## 2020-10-18 ENCOUNTER — Encounter: Payer: Managed Care, Other (non HMO) | Admitting: Family Medicine

## 2021-02-16 ENCOUNTER — Ambulatory Visit: Payer: Managed Care, Other (non HMO) | Admitting: Family Medicine

## 2021-03-07 ENCOUNTER — Other Ambulatory Visit: Payer: Self-pay | Admitting: Family Medicine

## 2021-03-07 DIAGNOSIS — E785 Hyperlipidemia, unspecified: Secondary | ICD-10-CM

## 2021-03-08 NOTE — Telephone Encounter (Signed)
Pt informed that prescription has been sent to pharmacy

## 2021-03-08 NOTE — Progress Notes (Deleted)
Name: Jimmy Bryant   MRN: [id number]    DOB: [birth year]   Date:03/08/2021       Progress Note  Subjective  Chief Complaint  Back pain/Follow up   HPI Morbid obesity: hehad gastric bypass surgery on 11/09/2018 done at [facility] by Dr. Shawna Orleans. He tolerated procedure well, at the day of surgery his weight was 313 lbs he was stable 230's -234 lbs for months however since last visit he has gained almost 20 lbs. Travelling more again through his job and eating out more often. No dumping symdrome. His weight needs to be 208 lbs, his BMI is below 35 . Advised to take otc B12 three times a week . He tries to eat well, but snacking very often   Major Depression:he is doing well emotionally, states wife and sister also noticed that his mood is under control, however he had to change the time that he takes medication. He states he has been able to sleep 6 hours straight, he is taking Abilfy and Effexor in am's and is sleeping better   DM with ED:he has been off medication since bariatric surgery on 11/09/18, weight is going up, but glucose still under control at home, and today A1C still normal. He denies polyphagia, polydipsia or polyuria. ED is controlled with Viagra, he states lower extremity pain at night resolved with Lyrica, but now only taking it prn   HTN:off medication since surgery, bariatric and was doing well, no chest pain or palpitation.BP during his last visit was elevated but he was in pain, since then bp has been normal again   GERD: he is doing well, no longer having problems  Hyperlipidemia: he is back on statin therapy and we will recheck it during his physical exam   OSA: using CPAP machine every night, all night, he has one machine for the house and one for his truck.He has noticed that since his weight loss,pressure still at 9 . He is tolerating it well.   *** Patient Active Problem List   Diagnosis Date Noted  . Encounter for screening colonoscopy   . Benign neoplasm  of cecum   . Polyp of transverse colon   . S/P gastric bypass 02/23/2019  . Mild episode of recurrent major depressive disorder ([place]) 12/29/2015  . Dyslipidemia associated with type 2 diabetes mellitus ([place]) 04/30/2015  . Circadian rhythm sleep disorder, shift work type 04/23/2015  . Dyslipidemia 04/23/2015  . H/O lumbosacral spine surgery 04/23/2015  . H/O umbilical hernia repair 66/44/0347  . Dysmetabolic syndrome 42/59/5638  . Morbid (severe) obesity due to excess calories ([place]) 04/23/2015  . ED (erectile dysfunction) of organic origin 04/23/2015  . Obstructive apnea 04/23/2015  . Vitamin D deficiency 04/23/2015  . Decreased testosterone level 04/23/2015  . Gastro-esophageal reflux disease without esophagitis 08/22/2009    Past Surgical History:  Procedure Laterality Date  . COLONOSCOPY WITH PROPOFOL N/A 08/13/2019   Procedure: COLONOSCOPY WITH BIOPSY;  Surgeon: Lucilla Lame, MD;  Location: [place];  Service: Endoscopy;  Laterality: N/A;  . microdecompression lumbar spine  02/2011   L4-5 and L5-S1 by Dr. Babette Relic  . POLYPECTOMY N/A 08/13/2019   Procedure: POLYPECTOMY;  Surgeon: Lucilla Lame, MD;  Location: [place];  Service: Endoscopy;  Laterality: N/A;  . UMBILICAL HERNIA REPAIR  07/10/2011    Family History  Problem Relation Age of Onset  . Diabetes Mother   . Hypertension Father   . CAD Father   . Hypertension Sister   . Hypertension Daughter   .  ADD / ADHD Son     Social History   Tobacco Use  . Smoking status: Former Smoker    Packs/day: 1.00    Years: 31.00    Pack years: 31.00    Types: Cigarettes    Start date: 10/28/1980    Quit date: 06/28/2012    Years since quitting: 8.6  . Smokeless tobacco: Never Used  Substance Use Topics  . Alcohol use: Yes    Alcohol/week: 0.0 standard drinks    Comment: rarely-a beer a month     Current Outpatient Medications:  .  gabapentin (NEURONTIN) 300 MG capsule, Take by mouth., Disp: , Rfl:  .   hydrochlorothiazide (HYDRODIURIL) 12.5 MG tablet, Take by mouth., Disp: , Rfl:  .  ARIPiprazole (ABILIFY) 5 MG tablet, Take 1 tablet (5 mg total) by mouth daily., Disp: 90 tablet, Rfl: 1 .  atorvastatin (LIPITOR) 40 MG tablet, TAKE 1 TABLET(40 MG) BY MOUTH DAILY, Disp: 30 tablet, Rfl: 0 .  Cholecalciferol (VITAMIN D3) 2000 UNITS capsule, Take 1 capsule by mouth daily., Disp: , Rfl:  .  Cyanocobalamin (B-12) 1000 MCG SUBL, Place 1 tablet under the tongue 3 (three) times a week., Disp: 30 tablet, Rfl: 1 .  cyclobenzaprine (FLEXERIL) 10 MG tablet, Take 1 tablet by mouth at bedtime as needed., Disp: , Rfl:  .  Multiple Vitamin (MULTI-VITAMIN) tablet, Take 2 tablets by mouth daily., Disp: , Rfl:  .  Multiple Vitamins-Minerals (MULTIVITAMIN WITH MINERALS) tablet, Take 1 tablet by mouth daily., Disp: , Rfl:  .  predniSONE (STERAPRED UNI-PAK 21 TAB) 10 MG (21) TBPK tablet, See admin instructions. follow package directions, Disp: , Rfl:  .  pregabalin (LYRICA) 50 MG capsule, Take 1 capsule (50 mg total) by mouth 3 (three) times daily. Start at night and go up on dose or take TID, Disp: 90 capsule, Rfl: 1 .  sildenafil (VIAGRA) 100 MG tablet, Take 1 tablet (100 mg total) by mouth daily as needed for erectile dysfunction., Disp: 30 tablet, Rfl: 2 .  traMADol (ULTRAM) 50 MG tablet, Take 50 mg by mouth every 6 (six) hours as needed., Disp: , Rfl:  .  venlafaxine XR (EFFEXOR-XR) 150 MG 24 hr capsule, Take 1 capsule (150 mg total) by mouth daily., Disp: 90 capsule, Rfl: 1  Allergies  Allergen Reactions  . Oxycodone-Acetaminophen Itching  . Trulicity [Dulaglutide] Nausea Only    And diarrhea    I personally reviewed {Reviewed:14835} with the patient/caregiver today.   ROS  ***  Objective  There were no vitals filed for this visit.  There is no height or weight on file to calculate BMI.  Physical Exam ***  No results found for this or any previous visit (from the past 2160 hour(s)).  Diabetic  Foot Exam: Diabetic Foot Exam - Simple   No data filed    ***  PHQ2/9: Depression screen [facility] 2/9 08/18/2020 02/11/2020 10/11/2019 10/11/2019 07/12/2019  Decreased Interest 0 0 0 0 0  Down, Depressed, Hopeless 0 0 0 0 0  PHQ - 2 Score 0 0 0 0 0  Altered sleeping - 3 0 0 3  Tired, decreased energy - 1 0 0 1  Change in appetite - 1 0 0 0  Feeling bad or failure about yourself  - 0 0 0 0  Trouble concentrating - 0 0 0 0  Moving slowly or fidgety/restless - 0 0 0 0  Suicidal thoughts - 0 0 0 0  PHQ-9 Score - 5 0 0 4  Difficult doing work/chores - Somewhat difficult Not difficult at all - Somewhat difficult  Some recent data might be hidden    phq 9 is {gen pos [id number]} ***  Fall Risk: Fall Risk  08/18/2020 02/11/2020 10/11/2019 10/11/2019 07/12/2019  Falls in the past year? 0 0 0 0 0  Number falls in past yr: 0 0 0 0 0  Injury with Fall? 0 0 0 0 0  Comment - - - - -  Follow up Falls evaluation completed - - - -   ***   Functional Status Survey:   ***   Assessment & Plan  *** 1. Controlled type 2 diabetes with neuropathy (HCC) ***  2. Encounter for hepatitis C screening test for low risk patient ***

## 2021-03-09 ENCOUNTER — Ambulatory Visit: Payer: Managed Care, Other (non HMO) | Admitting: Family Medicine

## 2021-03-12 ENCOUNTER — Encounter: Payer: Self-pay | Admitting: Family Medicine

## 2021-03-12 ENCOUNTER — Other Ambulatory Visit: Payer: Self-pay

## 2021-03-12 DIAGNOSIS — E785 Hyperlipidemia, unspecified: Secondary | ICD-10-CM

## 2021-03-12 MED ORDER — ATORVASTATIN CALCIUM 40 MG PO TABS: ORAL_TABLET | ORAL | 0 refills | Status: DC

## 2021-05-24 ENCOUNTER — Encounter: Payer: Self-pay | Admitting: Family Medicine

## 2021-05-24 NOTE — Progress Notes (Signed)
Name: Jimmy Bryant   MRN: [id number]    DOB: [birth year]   Date:05/25/2021       Progress Note  Subjective  Chief Complaint  Follow Up  HPI  Morbid obesity: he had gastric bypass surgery on 11/09/2018 done at [facility] by Dr. Shawna Orleans. He tolerated procedure well, at the day of surgery his weight was 313 lbs he was stable 230's -234 lbs for months however gradually gaining weight back, today weight is 275 lbs  Travelling more again through his job and eating out more often. No dumping symdrome.  His weight needs to be 208 lbs, for his BMI to go  below 35 . We will check labs, he is going to see dietician from the bariatric center    Major Depression: he is doing well emotionally, he has been taking Abilify 5 mg and Effexor in am's , helps him with mood and also no sadness. He has noticed some twitching on his neck and also some frontal area, no ptosis.    DM with ED: he has been off medication since bariatric surgery on 11/09/18, weight is going up, he has not been checking glucose lately.  He denies polyphagia, polydipsia or polyuria. ED is controlled with Viagra but wife is having problems and they have not been sexually active in a while.Diabetes associated with obesity and HTN, bp has been at goal since bariatric surgery    HTN: off medication since surgery, bariatric and was doing well, no chest pain or palpitation .BP is trending up, he wants to discuss with dietician    GERD: he is doing well, no longer having problems. Unchanged    Hyperlipidemia: he is back on statin therapy, we will recheck labs today    OSA: using CPAP machine every night, all night, he has one machine for the house and one for his truck. Pressure still at 9 . He is compliant   Lumbar radiculitis: he had back surgery done about 10 years ago, but recently having some radiculitis right lower leg, he had steroid injection done a couple of months ago and symptoms resolved of radiculitis, but still has some lower back pain,  that is mild   Patient Active Problem List   Diagnosis Date Noted   Encounter for screening colonoscopy    Benign neoplasm of cecum    Polyp of transverse colon    S/P gastric bypass 02/23/2019   Mild episode of recurrent major depressive disorder ([place]) 12/29/2015   Dyslipidemia associated with type 2 diabetes mellitus ([place]) 04/30/2015   Circadian rhythm sleep disorder, shift work type 04/23/2015   Dyslipidemia 04/23/2015   H/O lumbosacral spine surgery 123456   H/O umbilical hernia repair 123456   Dysmetabolic syndrome 123456   Morbid (severe) obesity due to excess calories ([place]) 04/23/2015   ED (erectile dysfunction) of organic origin 04/23/2015   Obstructive apnea 04/23/2015   Vitamin D deficiency 04/23/2015   Decreased testosterone level 04/23/2015   Gastro-esophageal reflux disease without esophagitis 08/22/2009    Past Surgical History:  Procedure Laterality Date   COLONOSCOPY WITH PROPOFOL N/A 08/13/2019   Procedure: COLONOSCOPY WITH BIOPSY;  Surgeon: Lucilla Lame, MD;  Location: [place];  Service: Endoscopy;  Laterality: N/A;   microdecompression lumbar spine  02/2011   L4-5 and L5-S1 by Dr. Babette Relic   POLYPECTOMY N/A 08/13/2019   Procedure: POLYPECTOMY;  Surgeon: Lucilla Lame, MD;  Location: [place];  Service: Endoscopy;  Laterality: N/A;   UMBILICAL HERNIA REPAIR  07/10/2011  Family History  Problem Relation Age of Onset   Diabetes Mother    Hypertension Father    CAD Father    Hypertension Sister    Hypertension Daughter    ADD / ADHD Son     Social History   Tobacco Use   Smoking status: Former    Packs/day: 1.00    Years: 31.00    Pack years: 31.00    Types: Cigarettes    Start date: 10/28/1980    Quit date: 06/28/2012    Years since quitting: 8.9   Smokeless tobacco: Never  Substance Use Topics   Alcohol use: Yes    Alcohol/week: 0.0 standard drinks    Comment: rarely-a beer a month     Current Outpatient  Medications:    ARIPiprazole (ABILIFY) 5 MG tablet, Take 1 tablet (5 mg total) by mouth daily., Disp: 90 tablet, Rfl: 1   atorvastatin (LIPITOR) 40 MG tablet, TAKE 1 TABLET(40 MG) BY MOUTH DAILY, Disp: 90 tablet, Rfl: 0   sildenafil (VIAGRA) 100 MG tablet, Take 1 tablet (100 mg total) by mouth daily as needed for erectile dysfunction., Disp: 30 tablet, Rfl: 2   venlafaxine XR (EFFEXOR-XR) 150 MG 24 hr capsule, Take 1 capsule (150 mg total) by mouth daily., Disp: 90 capsule, Rfl: 1  Allergies  Allergen Reactions   Oxycodone-Acetaminophen Itching   Trulicity [Dulaglutide] Nausea Only    And diarrhea    I personally reviewed active problem list, medication list, allergies, family history, social history, health maintenance with the patient/caregiver today.   ROS  Constitutional: Negative for fever , positive for weight change.  Respiratory: Negative for cough and shortness of breath.   Cardiovascular: Negative for chest pain or palpitations.  Gastrointestinal: Negative for abdominal pain, no bowel changes.  Musculoskeletal: Negative for gait problem or joint swelling.  Skin: Negative for rash.  Neurological: Negative for dizziness or headache.  No other specific complaints in a complete review of systems (except as listed in HPI above).   Objective  Vitals:   05/25/21 1437  BP: 138/72  Pulse: 86  Resp: 16  Temp: 98.3 F (36.8 C)  SpO2: 98%  Weight: 275 lb 4.8 oz (124.9 kg)  Height: '5\' 10"'$  (1.778 m)    Body mass index is 39.5 kg/m.  Physical Exam  Constitutional: Patient appears well-developed and well-nourished. Obese  No distress.  HEENT: head atraumatic, normocephalic, pupils equal and reactive to light, neck supple Cardiovascular: Normal rate, regular rhythm and normal heart sounds.  No murmur heard. No BLE edema. Pulmonary/Chest: Effort normal and breath sounds normal. No respiratory distress. Abdominal: Soft.  There is no tenderness. Muscular skeletal /neuro:  fasciculations on anterior neck  Psychiatric: Patient has a normal mood and affect. behavior is normal. Judgment and thought content normal.   Diabetic Foot Exam: Diabetic Foot Exam - Simple   Simple Foot Form Diabetic Foot exam was performed with the following findings: Yes 05/25/2021  3:14 PM  Visual Inspection No deformities, no ulcerations, no other skin breakdown bilaterally: Yes Sensation Testing Intact to touch and monofilament testing bilaterally: Yes Pulse Check Posterior Tibialis and Dorsalis pulse intact bilaterally: Yes Comments     PHQ2/9: Depression screen [facility] 2/9 05/25/2021 08/18/2020 02/11/2020 10/11/2019 10/11/2019  Decreased Interest 0 0 0 0 0  Down, Depressed, Hopeless 0 0 0 0 0  PHQ - 2 Score 0 0 0 0 0  Altered sleeping 0 - 3 0 0  Tired, decreased energy 0 - 1 0 0  Change  in appetite 0 - 1 0 0  Feeling bad or failure about yourself  0 - 0 0 0  Trouble concentrating 0 - 0 0 0  Moving slowly or fidgety/restless 0 - 0 0 0  Suicidal thoughts 0 - 0 0 0  PHQ-9 Score 0 - 5 0 0  Difficult doing work/chores Not difficult at all - Somewhat difficult Not difficult at all -  Some recent data might be hidden    phq 9 is negative   Fall Risk: Fall Risk  05/25/2021 08/18/2020 02/11/2020 10/11/2019 10/11/2019  Falls in the past year? 0 0 0 0 0  Number falls in past yr: 0 0 0 0 0  Injury with Fall? 0 0 0 0 0  Comment - - - - -  Follow up - Falls evaluation completed - - -     Functional Status Survey: Is the patient deaf or have difficulty hearing?: No Does the patient have difficulty seeing, even when wearing glasses/contacts?: No Does the patient have difficulty concentrating, remembering, or making decisions?: No Does the patient have difficulty walking or climbing stairs?: No Does the patient have difficulty dressing or bathing?: No Does the patient have difficulty doing errands alone such as visiting a doctor's office or shopping?: No    Assessment &  Plan  1. Controlled type 2 diabetes with neuropathy (HCC)  - POCT HgB A1C - HM Diabetes Foot Exam - Microalbumin / creatinine urine ratio  2. Need for Tdap vaccination  - Tdap vaccine greater than or equal to 7yo IM  3. Gastroesophageal reflux disease without esophagitis   4. Vitamin D deficiency  - VITAMIN D 25 Hydroxy (Vit-D Deficiency, Fractures)  5 Dyslipidemia associated with type 2 diabetes mellitus ([place])  - Lipid Profile  6. B12 deficiency  - Vitamin B12  7. Dyslipidemia  - Lipid Profile - atorvastatin (LIPITOR) 40 MG tablet; TAKE 1 TABLET(40 MG) BY MOUTH DAILY  Dispense: 90 tablet; Refill: 1  8. Obstructive apnea   9. History of bariatric surgery  - Iron, TIBC and Ferritin Panel - CBC with Differential/Platelet  10. Long-term use of high-risk medication  - Comprehensive metabolic panel  11. Muscle fasciculation  Possible tardive dyskinesia   12. Need for hepatitis C screening test  - Hepatitis C Antibody  13. Major depression in remission (HCC)  - venlafaxine XR (EFFEXOR-XR) 150 MG 24 hr capsule; Take 1 capsule (150 mg total) by mouth daily.  Dispense: 90 capsule; Refill: 1 - ARIPiprazole (ABILIFY) 2 MG tablet; Take 1 tablet (2 mg total) by mouth daily.  Dispense: 90 tablet; Refill: 0 - Ambulatory referral to Psychiatry

## 2021-05-25 ENCOUNTER — Ambulatory Visit: Payer: Managed Care, Other (non HMO) | Admitting: Family Medicine

## 2021-05-25 ENCOUNTER — Encounter: Payer: Self-pay | Admitting: Family Medicine

## 2021-05-25 ENCOUNTER — Other Ambulatory Visit: Payer: Self-pay

## 2021-05-25 VITALS — BP 138/72 | HR 86 | Temp 98.3°F | Resp 16 | Ht 70.0 in | Wt 275.3 lb

## 2021-05-25 DIAGNOSIS — E1140 Type 2 diabetes mellitus with diabetic neuropathy, unspecified: Secondary | ICD-10-CM

## 2021-05-25 DIAGNOSIS — G4733 Obstructive sleep apnea (adult) (pediatric): Secondary | ICD-10-CM

## 2021-05-25 DIAGNOSIS — E1169 Type 2 diabetes mellitus with other specified complication: Secondary | ICD-10-CM

## 2021-05-25 DIAGNOSIS — Z9884 Bariatric surgery status: Secondary | ICD-10-CM

## 2021-05-25 DIAGNOSIS — R253 Fasciculation: Secondary | ICD-10-CM

## 2021-05-25 DIAGNOSIS — Z1159 Encounter for screening for other viral diseases: Secondary | ICD-10-CM

## 2021-05-25 DIAGNOSIS — Z79899 Other long term (current) drug therapy: Secondary | ICD-10-CM

## 2021-05-25 DIAGNOSIS — E559 Vitamin D deficiency, unspecified: Secondary | ICD-10-CM | POA: Diagnosis not present

## 2021-05-25 DIAGNOSIS — F325 Major depressive disorder, single episode, in full remission: Secondary | ICD-10-CM

## 2021-05-25 DIAGNOSIS — Z23 Encounter for immunization: Secondary | ICD-10-CM | POA: Diagnosis not present

## 2021-05-25 DIAGNOSIS — E538 Deficiency of other specified B group vitamins: Secondary | ICD-10-CM

## 2021-05-25 DIAGNOSIS — K219 Gastro-esophageal reflux disease without esophagitis: Secondary | ICD-10-CM

## 2021-05-25 DIAGNOSIS — E785 Hyperlipidemia, unspecified: Secondary | ICD-10-CM

## 2021-05-25 LAB — POCT GLYCOSYLATED HEMOGLOBIN (HGB A1C): Hemoglobin A1C: 5.1 % (ref 4.0–5.6)

## 2021-05-25 MED ORDER — VENLAFAXINE HCL ER 150 MG PO CP24: 150.0000 mg | ORAL_CAPSULE | Freq: Every day | ORAL | 1 refills | Status: DC

## 2021-05-25 MED ORDER — ARIPIPRAZOLE 2 MG PO TABS: 2.0000 mg | ORAL_TABLET | Freq: Every day | ORAL | 0 refills | Status: DC

## 2021-05-25 MED ORDER — ATORVASTATIN CALCIUM 40 MG PO TABS: ORAL_TABLET | ORAL | 1 refills | Status: DC

## 2021-06-18 ENCOUNTER — Encounter: Payer: Self-pay | Admitting: Family Medicine

## 2021-06-19 LAB — LIPID PANEL
Chol/HDL Ratio: 3.1 ratio (ref 0.0–5.0)
Cholesterol, Total: 115 mg/dL (ref 100–199)
HDL: 37 mg/dL — ABNORMAL LOW (ref >39)
LDL Chol Calc (NIH): 60 mg/dL (ref 0–99)
Triglycerides: 94 mg/dL (ref 0–149)
VLDL Cholesterol Cal: 18 mg/dL (ref 5–40)

## 2021-06-19 LAB — CBC WITH DIFFERENTIAL/PLATELET
Basophils Absolute: 0.1 10*3/uL (ref 0.0–0.2)
Basos: 1 %
EOS (ABSOLUTE): 0.2 10*3/uL (ref 0.0–0.4)
Eos: 3 %
Hematocrit: 41.4 % (ref 37.5–51.0)
Hemoglobin: 13.6 g/dL (ref 13.0–17.7)
Immature Grans (Abs): 0 10*3/uL (ref 0.0–0.1)
Immature Granulocytes: 0 %
Lymphocytes Absolute: 1.8 10*3/uL (ref 0.7–3.1)
Lymphs: 30 %
MCH: 27.7 pg (ref 26.6–33.0)
MCHC: 32.9 g/dL (ref 31.5–35.7)
MCV: 84 fL (ref 79–97)
Monocytes Absolute: 0.5 10*3/uL (ref 0.1–0.9)
Monocytes: 8 %
Neutrophils Absolute: 3.4 10*3/uL (ref 1.4–7.0)
Neutrophils: 58 %
Platelets: 263 10*3/uL (ref 150–450)
RBC: 4.91 x10E6/uL (ref 4.14–5.80)
RDW: 13.1 % (ref 11.6–15.4)
WBC: 5.9 10*3/uL (ref 3.4–10.8)

## 2021-06-19 LAB — COMPREHENSIVE METABOLIC PANEL
ALT: 23 IU/L (ref 0–44)
AST: 20 IU/L (ref 0–40)
Albumin/Globulin Ratio: 1.6 (ref 1.2–2.2)
Albumin: 4.3 g/dL (ref 3.8–4.9)
Alkaline Phosphatase: 93 IU/L (ref 44–121)
BUN/Creatinine Ratio: 9 (ref 9–20)
BUN: 9 mg/dL (ref 6–24)
Bilirubin Total: 0.4 mg/dL (ref 0.0–1.2)
CO2: 26 mmol/L (ref 20–29)
Calcium: 9.4 mg/dL (ref 8.7–10.2)
Chloride: 101 mmol/L (ref 96–106)
Creatinine, Ser: 0.99 mg/dL (ref 0.76–1.27)
Globulin, Total: 2.7 g/dL (ref 1.5–4.5)
Glucose: 95 mg/dL (ref 65–99)
Potassium: 4.8 mmol/L (ref 3.5–5.2)
Sodium: 141 mmol/L (ref 134–144)
Total Protein: 7 g/dL (ref 6.0–8.5)
eGFR: 92 mL/min/{1.73_m2} (ref >59)

## 2021-06-19 LAB — IRON,TIBC AND FERRITIN PANEL
Ferritin: 94 ng/mL (ref 30–400)
Iron Saturation: 23 % (ref 15–55)
Iron: 65 ug/dL (ref 38–169)
Total Iron Binding Capacity: 283 ug/dL (ref 250–450)
UIBC: 218 ug/dL (ref 111–343)

## 2021-06-19 LAB — MICROALBUMIN / CREATININE URINE RATIO
Creatinine, Urine: 113.1 mg/dL
Microalb/Creat Ratio: 8 mg/g creat (ref 0–29)
Microalbumin, Urine: 8.7 ug/mL

## 2021-06-19 LAB — VITAMIN D 25 HYDROXY (VIT D DEFICIENCY, FRACTURES): Vit D, 25-Hydroxy: 39.3 ng/mL (ref 30.0–100.0)

## 2021-06-19 LAB — VITAMIN B12: Vitamin B-12: 399 pg/mL (ref 232–1245)

## 2021-06-19 LAB — HEPATITIS C ANTIBODY: Hep C Virus Ab: <0.1 s/co ratio (ref 0.0–0.9)

## 2021-07-05 ENCOUNTER — Encounter: Payer: Self-pay | Admitting: Family Medicine

## 2021-07-09 ENCOUNTER — Other Ambulatory Visit: Payer: Self-pay | Admitting: Family Medicine

## 2021-07-10 NOTE — Progress Notes (Signed)
Name: Jimmy Bryant   MRN: [id number]    DOB: [birth year]   Date:07/11/2021       Progress Note  Subjective  Chief Complaint  New Med/ EKG  HPI  Major Depression: he was on Effexor 150 mg and  Abilify 5 mg and was doing well emotionally however he started to noticed that he was biting his tongue, also some movements on his neck and sometimes on his eye lids, we discussed decreasing dose of Abilify since symptoms consistent with Tardive dyskinesia. We decreased dose of Abilify to 2 mg and symptoms are still present, he states he stopped taking it a few days and symptoms improved. He has an appointment with psychiatrist but not until December, since he went down on dose of Abilify he has been getting more snappy, gets frustrated easily, he is worried about stopping medication. We discussed Ingrezza , explained new medication , not sure if insurance will approve it , but we can try. Discussed side effects . Advised not to go up on dose of Abilify at this time   We decided to not start Ingrezza but try switching from Abilify to Trazodone to see if controls symptoms and decreases adverse side effects   Patient Active Problem List   Diagnosis Date Noted   Encounter for screening colonoscopy    Benign neoplasm of cecum    Polyp of transverse colon    S/P gastric bypass 02/23/2019   Mild episode of recurrent major depressive disorder ([place]) 12/29/2015   Dyslipidemia associated with type 2 diabetes mellitus ([place]) 04/30/2015   Circadian rhythm sleep disorder, shift work type 04/23/2015   Dyslipidemia 04/23/2015   H/O lumbosacral spine surgery 70/62/3762   H/O umbilical hernia repair 83/15/1761   Dysmetabolic syndrome 60/73/7106   Morbid (severe) obesity due to excess calories ([place]) 04/23/2015   ED (erectile dysfunction) of organic origin 04/23/2015   Obstructive apnea 04/23/2015   Vitamin D deficiency 04/23/2015   Decreased testosterone level 04/23/2015   Gastro-esophageal reflux disease  without esophagitis 08/22/2009    Past Surgical History:  Procedure Laterality Date   COLONOSCOPY WITH PROPOFOL N/A 08/13/2019   Procedure: COLONOSCOPY WITH BIOPSY;  Surgeon: Lucilla Lame, MD;  Location: [place];  Service: Endoscopy;  Laterality: N/A;   microdecompression lumbar spine  02/2011   L4-5 and L5-S1 by Dr. Babette Relic   POLYPECTOMY N/A 08/13/2019   Procedure: POLYPECTOMY;  Surgeon: Lucilla Lame, MD;  Location: [place];  Service: Endoscopy;  Laterality: N/A;   UMBILICAL HERNIA REPAIR  07/10/2011    Family History  Problem Relation Age of Onset   Diabetes Mother    Hypertension Father    CAD Father    Hypertension Sister    Hypertension Daughter    ADD / ADHD Son     Social History   Tobacco Use   Smoking status: Former    Packs/day: 1.00    Years: 31.00    Pack years: 31.00    Types: Cigarettes    Start date: 10/28/1980    Quit date: 06/28/2012    Years since quitting: 9.0   Smokeless tobacco: Never  Substance Use Topics   Alcohol use: Yes    Alcohol/week: 0.0 standard drinks    Comment: rarely-a beer a month     Current Outpatient Medications:    ARIPiprazole (ABILIFY) 2 MG tablet, Take 1 tablet (2 mg total) by mouth daily., Disp: 90 tablet, Rfl: 0   atorvastatin (LIPITOR) 40 MG tablet, TAKE 1 TABLET(40 MG) BY  MOUTH DAILY, Disp: 90 tablet, Rfl: 1   sildenafil (VIAGRA) 100 MG tablet, Take 1 tablet (100 mg total) by mouth daily as needed for erectile dysfunction., Disp: 30 tablet, Rfl: 2   venlafaxine XR (EFFEXOR-XR) 150 MG 24 hr capsule, Take 1 capsule (150 mg total) by mouth daily., Disp: 90 capsule, Rfl: 1  Allergies  Allergen Reactions   Oxycodone-Acetaminophen Itching   Trulicity [Dulaglutide] Nausea Only    And diarrhea    I personally reviewed active problem list, medication list, allergies, family history, social history, health maintenance with the patient/caregiver today.   ROS  Constitutional: Negative for fever or  weight change.  Respiratory: Negative for cough and shortness of breath.   Cardiovascular: Negative for chest pain or palpitations.  Gastrointestinal: Negative for abdominal pain, no bowel changes.  Musculoskeletal: Negative for gait problem or joint swelling.  Skin: Negative for rash.  Neurological: Negative for dizziness or headache.  No other specific complaints in a complete review of systems (except as listed in HPI above).   Objective  Vitals:   07/11/21 1031  BP: 128/78  Pulse: 72  Resp: 18  Temp: 98 F (36.7 C)  TempSrc: Oral  SpO2: 98%  Weight: 279 lb 6.4 oz (126.7 kg)  Height: _0  (1.778 m)    Body mass index is 40.09 kg/m.  Physical Exam  Constitutional: Patient appears well-developed and well-nourished. Obese  No distress.  HEENT: head atraumatic, normocephalic, pupils equal and reactive to light, neck supple Cardiovascular: Normal rate, regular rhythm and normal heart sounds.  No murmur heard. No BLE edema. Pulmonary/Chest: Effort normal and breath sounds normal. No respiratory distress. Abdominal: Soft.  There is no tenderness. Psychiatric: Patient has a normal mood and affect. behavior is normal. Judgment and thought content normal.   Recent Results (from the past 2160 hour(s))  POCT HgB A1C     Status: Normal   Collection Time: 05/25/21  3:23 PM  Result Value Ref Range   Hemoglobin A1C 5.1 4.0 - 5.6 %   HbA1c POC (<> result, manual entry)     HbA1c, POC (prediabetic range)     HbA1c, POC (controlled diabetic range)    Lipid Profile     Status: Abnormal   Collection Time: 06/18/21  7:03 AM  Result Value Ref Range   Cholesterol, Total 115 100 - 199 mg/dL   Triglycerides 94 0 - 149 mg/dL   HDL 37 (L) >39 mg/dL   VLDL Cholesterol Cal 18 5 - 40 mg/dL   LDL Chol Calc (NIH) 60 0 - 99 mg/dL   Chol/HDL Ratio 3.1 0.0 - 5.0 ratio    Comment:                                   T. Chol/HDL Ratio                                             Men  Women                                1/2 Avg.Risk  3.4    3.3  Avg.Risk  5.0    4.4                                2X Avg.Risk  9.6    7.1                                3X Avg.Risk 23.4   11.0   Hepatitis C Antibody     Status: None   Collection Time: 06/18/21  7:03 AM  Result Value Ref Range   Hep C Virus Ab <0.1 0.0 - 0.9 s/co ratio    Comment:                                   Negative:     < 0.8                              Indeterminate: 0.8 - 0.9                                   Positive:     > 0.9  HCV antibody alone does not differentiate between  previous resolved infection and active infection.  The CDC and current clinical guidelines recommend  that a positive HCV antibody result be followed up  with an HCV RNA test to support the diagnosis of  acute HCV infection. [facility] offers Hepatitis C  Virus (HCV) RNA, Diagnosis, NAA (665993) and  Hepatitis C Virus (HCV) Antibody with reflex to  Quantitative Real-time PCR (144050).   Vitamin B12     Status: None   Collection Time: 06/18/21  7:03 AM  Result Value Ref Range   Vitamin B-12 399 232 - 1,245 pg/mL  VITAMIN D 25 Hydroxy (Vit-D Deficiency, Fractures)     Status: None   Collection Time: 06/18/21  7:03 AM  Result Value Ref Range   Vit D, 25-Hydroxy 39.3 30.0 - 100.0 ng/mL    Comment: Vitamin D deficiency has been defined by the [place] practice guideline as a level of serum 25-OH vitamin D less than 20 ng/mL (1,2). The Endocrine Society went on to further define vitamin D insufficiency as a level between 21 and 29 ng/mL (2). 1. IOM (Institute of Medicine). 2010. Dietary reference    intakes for calcium and D. [place]: The    [employer]. 2. Holick MF, Binkley [place], Bischoff-Ferrari HA, et al.    Evaluation, treatment, and prevention of vitamin D    deficiency: an Endocrine Society clinical practice    guideline. JCEM. 2011 Jul;  96(7):1911-30.   Iron, TIBC and Ferritin Panel     Status: None   Collection Time: 06/18/21  7:03 AM  Result Value Ref Range   Total Iron Binding Capacity 283 250 - 450 ug/dL   UIBC 218 111 - 343 ug/dL   Iron 65 38 - 169 ug/dL   Iron Saturation 23 15 - 55 %   Ferritin 94 30 - 400 ng/mL  CBC with Differential/Platelet     Status: None   Collection Time: 06/18/21  7:03 AM  Result Value Ref Range   WBC 5.9 3.4 - 10.8 x10E3/uL  RBC 4.91 4.14 - 5.80 x10E6/uL   Hemoglobin 13.6 13.0 - 17.7 g/dL   Hematocrit 41.4 37.5 - 51.0 %   MCV 84 79 - 97 fL   MCH 27.7 26.6 - 33.0 pg   MCHC 32.9 31.5 - 35.7 g/dL   RDW 13.1 11.6 - 15.4 %   Platelets 263 150 - 450 x10E3/uL   Neutrophils 58 Not Estab. %   Lymphs 30 Not Estab. %   Monocytes 8 Not Estab. %   Eos 3 Not Estab. %   Basos 1 Not Estab. %   Neutrophils Absolute 3.4 1.4 - 7.0 x10E3/uL   Lymphocytes Absolute 1.8 0.7 - 3.1 x10E3/uL   Monocytes Absolute 0.5 0.1 - 0.9 x10E3/uL   EOS (ABSOLUTE) 0.2 0.0 - 0.4 x10E3/uL   Basophils Absolute 0.1 0.0 - 0.2 x10E3/uL   Immature Granulocytes 0 Not Estab. %   Immature Grans (Abs) 0.0 0.0 - 0.1 x10E3/uL  Microalbumin / creatinine urine ratio     Status: None   Collection Time: 06/18/21  7:03 AM  Result Value Ref Range   Creatinine, Urine 113.1 Not Estab. mg/dL   Microalbumin, Urine 8.7 Not Estab. ug/mL   Microalb/Creat Ratio 8 0 - 29 mg/g creat    Comment:                        Normal:                0 -  29                        Moderately increased: 30 - 300                        Severely increased:       >300   Comprehensive metabolic panel     Status: None   Collection Time: 06/18/21  7:03 AM  Result Value Ref Range   Glucose 95 65 - 99 mg/dL   BUN 9 6 - 24 mg/dL   Creatinine, Ser 0.99 0.76 - 1.27 mg/dL   eGFR 92 >59 mL/min/1.73   BUN/Creatinine Ratio 9 9 - 20   Sodium 141 134 - 144 mmol/L   Potassium 4.8 3.5 - 5.2 mmol/L   Chloride 101 96 - 106 mmol/L   CO2 26 20 - 29 mmol/L    Calcium 9.4 8.7 - 10.2 mg/dL   Total Protein 7.0 6.0 - 8.5 g/dL   Albumin 4.3 3.8 - 4.9 g/dL   Globulin, Total 2.7 1.5 - 4.5 g/dL   Albumin/Globulin Ratio 1.6 1.2 - 2.2   Bilirubin Total 0.4 0.0 - 1.2 mg/dL   Alkaline Phosphatase 93 44 - 121 IU/L   AST 20 0 - 40 IU/L   ALT 23 0 - 44 IU/L     PHQ2/9: Depression screen [facility] 2/9 07/11/2021 05/25/2021 08/18/2020 02/11/2020 10/11/2019  Decreased Interest 0 0 0 0 0  Down, Depressed, Hopeless 0 0 0 0 0  PHQ - 2 Score 0 0 0 0 0  Altered sleeping 0 0 - 3 0  Tired, decreased energy 0 0 - 1 0  Change in appetite 0 0 - 1 0  Feeling bad or failure about yourself  0 0 - 0 0  Trouble concentrating 0 0 - 0 0  Moving slowly or fidgety/restless 0 0 - 0 0  Suicidal thoughts 0 0 - 0 0  PHQ-9 Score 0  0 - 5 0  Difficult doing work/chores - Not difficult at all - Somewhat difficult Not difficult at all  Some recent data might be hidden    phq 9 is negative   Fall Risk: Fall Risk  07/11/2021 05/25/2021 08/18/2020 02/11/2020 10/11/2019  Falls in the past year? 0 0 0 0 0  Number falls in past yr: 0 0 0 0 0  Injury with Fall? 0 0 0 0 0  Comment - - - - -  Follow up - - Falls evaluation completed - -     Functional Status Survey: Is the patient deaf or have difficulty hearing?: No Does the patient have difficulty seeing, even when wearing glasses/contacts?: No Does the patient have difficulty concentrating, remembering, or making decisions?: No Does the patient have difficulty walking or climbing stairs?: No Does the patient have difficulty dressing or bathing?: No Does the patient have difficulty doing errands alone such as visiting a doctor's office or shopping?: No    Assessment & Plan  1. Mild episode of recurrent major depressive disorder (HCC)  - traZODone (DESYREL) 50 MG tablet; Take 1-3 tablets (50-150 mg total) by mouth at bedtime as needed for sleep.  Dispense: 90 tablet; Refill: 0  2. Need for immunization against influenza  - Flu  Vaccine QUAD 29moIM (Fluarix, Fluzone & Alfiuria Quad PF)  3. Long-term use of high-risk medication  - EKG 12-Lead  4. Myoclonus   5. Tardive dyskinesia  Stop Abilify for now

## 2021-07-11 ENCOUNTER — Ambulatory Visit: Payer: Managed Care, Other (non HMO) | Admitting: Family Medicine

## 2021-07-11 ENCOUNTER — Other Ambulatory Visit: Payer: Self-pay

## 2021-07-11 ENCOUNTER — Encounter: Payer: Self-pay | Admitting: Family Medicine

## 2021-07-11 VITALS — BP 128/78 | HR 72 | Temp 98.0°F | Resp 18 | Ht 70.0 in | Wt 279.4 lb

## 2021-07-11 DIAGNOSIS — G2401 Drug induced subacute dyskinesia: Secondary | ICD-10-CM

## 2021-07-11 DIAGNOSIS — Z23 Encounter for immunization: Secondary | ICD-10-CM | POA: Diagnosis not present

## 2021-07-11 DIAGNOSIS — G253 Myoclonus: Secondary | ICD-10-CM | POA: Diagnosis not present

## 2021-07-11 DIAGNOSIS — Z79899 Other long term (current) drug therapy: Secondary | ICD-10-CM

## 2021-07-11 DIAGNOSIS — F330 Major depressive disorder, recurrent, mild: Secondary | ICD-10-CM | POA: Diagnosis not present

## 2021-07-11 MED ORDER — TRAZODONE HCL 50 MG PO TABS: 50.0000 mg | ORAL_TABLET | Freq: Every evening | ORAL | 0 refills | Status: DC | PRN

## 2021-07-11 MED ORDER — VALBENAZINE TOSYLATE 40 MG PO CAPS: 40.0000 mg | ORAL_CAPSULE | Freq: Every day | ORAL | 2 refills | Status: DC

## 2021-08-23 ENCOUNTER — Encounter: Payer: Self-pay | Admitting: Family Medicine

## 2021-08-23 ENCOUNTER — Other Ambulatory Visit: Payer: Self-pay | Admitting: Family Medicine

## 2021-08-23 DIAGNOSIS — F330 Major depressive disorder, recurrent, mild: Secondary | ICD-10-CM

## 2021-08-24 NOTE — Telephone Encounter (Signed)
Requested Prescriptions  Pending Prescriptions Disp Refills  . traZODone (DESYREL) 50 MG tablet [Pharmacy Med Name: TRAZODONE 50MG  TABLETS] 34 tablet 0    Sig: TAKE 1 TO 3 TABLETS(50 TO 150 MG) BY MOUTH AT BEDTIME AS NEEDED FOR SLEEP     Psychiatry: Antidepressants - Serotonin Modulator Passed - 08/23/2021 11:20 PM      Passed - Completed PHQ-2 or PHQ-9 in the last 360 days      Passed - Valid encounter within last 6 months    Recent Outpatient Visits          1 month ago Mild episode of recurrent major depressive disorder [facility])   [facility], Drue Stager, MD   3 months ago Controlled type 2 diabetes with neuropathy [facility])   [facility] Steele Sizer, MD   1 year ago Controlled type 2 diabetes with neuropathy [facility])   [facility] Steele Sizer, MD   1 year ago Dyslipidemia associated with type 2 diabetes mellitus [facility])   [facility] Steele Sizer, MD   1 year ago Dyslipidemia associated with type 2 diabetes mellitus [facility])   [facility] Steele Sizer, MD      Future Appointments            In 1 week Steele Sizer, MD [facility], [facility]

## 2021-08-30 NOTE — Progress Notes (Signed)
Name: Jimmy Bryant   MRN: [id number]    DOB: February 11, 1971   Date:08/30/2021       Progress Note  Subjective  Chief Complaint  Follow Up  I connected with  Jimmy Bryant  on 08/30/21 at  3:40 PM EDT by a video enabled telemedicine application and verified that I am speaking with the correct person using two identifiers.  I discussed the limitations of evaluation and management by telemedicine and the availability of in person appointments. The patient expressed understanding and agreed to proceed with the virtual visit  Staff also discussed with the patient that there may be a patient responsible charge related to this service. Patient Location: at work/in his truck Provider Location: [facility] Additional Individuals present: alone   HPI  Morbid obesity: he had gastric bypass surgery on 11/09/2018 done at [facility] by Dr. Shawna Orleans. He tolerated procedure well, at the day of surgery his weight was 313 lbs he was stable 230's -234 lbs for months however gradually gaining weight back, today weight has been between 273 -279 lbs   He is working on the road from M-F, trying to eat beef jerky and protein balls. No dumping symdrome.  His weight needs to be 208 lbs, for his BMI to go  below 35 . We will check labs, he is going to see dietician from the bariatric center     DM with ED: he has been off medication since bariatric surgery on 11/09/18, weight is stable now, he states glucose at home has been low 100's lately  He denies polyphagia, polydipsia or polyuria. ED is controlled with Viagra but wife is having problems and they have not been sexually active in a while. Diabetes associated with obesity and HTN, bp has been at goal since bariatric surgery    HTN: off medication since surgery, bariatric and was doing well, no chest pain or palpitation . It was a virtual visit and unable to check bp    GERD: he is doing well, no longer having problems. Resolved    Hyperlipidemia: he is back on statin therapy,  last LDL down to 60, HDL was low again , needs to eat tree nuts and also fish and increase physical activity    OSA: using CPAP machine every night, all night, he has one machine for the house and one for his truck. Pressure still at 9 .  Major Depression: he was on Effexor 150 mg and  Abilify 5 mg and was doing well emotionally however he developed symptoms of tardive dyskinesia such as biting his tongue, also involuntary movements on his neck and sometimes on his eye lids.  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/23/2019   Mild episode of recurrent major depressive disorder ([place]) 12/29/2015   Dyslipidemia associated with type 2 diabetes mellitus ([place]) 04/30/2015   Circadian rhythm sleep disorder, shift work type 04/23/2015   Dyslipidemia 04/23/2015   H/O lumbosacral spine surgery 42/70/6237   H/O umbilical hernia repair 62/83/1517   Dysmetabolic syndrome 61/60/7371   Morbid (severe) obesity due to excess calories ([place]) 04/23/2015   ED (erectile dysfunction) of organic origin 04/23/2015   Obstructive apnea 04/23/2015   Vitamin D  deficiency 04/23/2015   Decreased testosterone level 04/23/2015   Gastro-esophageal reflux disease without esophagitis 08/22/2009    Past Surgical History:  Procedure Laterality Date   COLONOSCOPY WITH PROPOFOL N/A 08/13/2019   Procedure: COLONOSCOPY WITH BIOPSY;  Surgeon: Lucilla Lame, MD;  Location: [place];  Service: Endoscopy;  Laterality: N/A;   microdecompression lumbar spine  02/2011   L4-5 and L5-S1 by Dr. Babette Relic   POLYPECTOMY N/A 08/13/2019   Procedure: POLYPECTOMY;  Surgeon: Lucilla Lame, MD;  Location: [place];  Service: Endoscopy;  Laterality: N/A;   UMBILICAL HERNIA REPAIR   07/10/2011    Family History  Problem Relation Age of Onset   Diabetes Mother    Hypertension Father    CAD Father    Hypertension Sister    Hypertension Daughter    ADD / ADHD Son     Social History   Socioeconomic History   Marital status: Married    Spouse name: Claiborne Billings   Number of children: 1   Years of education: Not on file   Highest education level: 8th grade  Occupational History   Not on file  Tobacco Use   Smoking status: Former    Packs/day: 1.00    Years: 31.00    Pack years: 31.00    Types: Cigarettes    Start date: 10/28/1980    Quit date: 06/28/2012    Years since quitting: 9.1   Smokeless tobacco: Never  Vaping Use   Vaping Use: Never used  Substance and Sexual Activity   Alcohol use: Yes    Alcohol/week: 0.0 standard drinks    Comment: rarely-a beer a month   Drug use: No   Sexual activity: Yes    Partners: Female  Other Topics Concern   Not on file  Social History Narrative   Not on file   Social Determinants of Health   Financial Resource Strain: Not on file  Food Insecurity: Not on file  Transportation Needs: Not on file  Physical Activity: Not on file  Stress: Not on file  Social Connections: Not on file  Intimate Partner Violence: Not on file     Current Outpatient Medications:    atorvastatin (LIPITOR) 40 MG tablet, TAKE 1 TABLET(40 MG) BY MOUTH DAILY, Disp: 90 tablet, Rfl: 1   sildenafil (VIAGRA) 100 MG tablet, Take 1 tablet (100 mg total) by mouth daily as needed for erectile dysfunction., Disp: 30 tablet, Rfl: 2   traZODone (DESYREL) 50 MG tablet, TAKE 1 TO 3 TABLETS(50 TO 150 MG) BY MOUTH AT BEDTIME AS NEEDED FOR SLEEP, Disp: 34 tablet, Rfl: 0   venlafaxine XR (EFFEXOR-XR) 150 MG 24 hr capsule, Take 1 capsule (150 mg total) by mouth daily., Disp: 90 capsule, Rfl: 1  Allergies  Allergen Reactions   Oxycodone-Acetaminophen Itching   Trulicity [Dulaglutide] Nausea Only    And diarrhea    I personally reviewed active problem  list, medication list, allergies, family history, social history, health maintenance with the patient/caregiver today.   ROS  Ten systems reviewed and is negative except as mentioned in HPI   Objective  Virtual encounter, vitals not obtained.  There is no height or weight on file to calculate BMI.  Physical Exam  Awake, alert and oriented   PHQ2/9: Depression screen [facility] 2/9 07/11/2021 05/25/2021 08/18/2020 02/11/2020 10/11/2019  Decreased Interest 0 0 0 0 0  Down, Depressed, Hopeless 0 0 0 0 0  PHQ - 2 Score 0 0 0 0 0  Altered sleeping 0  0 - 3 0  Tired, decreased energy 0 0 - 1 0  Change in appetite 0 0 - 1 0  Feeling bad or failure about yourself  0 0 - 0 0  Trouble concentrating 0 0 - 0 0  Moving slowly or fidgety/restless 0 0 - 0 0  Suicidal thoughts 0 0 - 0 0  PHQ-9 Score 0 0 - 5 0  Difficult doing work/chores - Not difficult at all - Somewhat difficult Not difficult at all  Some recent data might be hidden   PHQ-2/9 Result is negative.    Fall Risk: Fall Risk  07/11/2021 05/25/2021 08/18/2020 02/11/2020 10/11/2019  Falls in the past year? 0 0 0 0 0  Number falls in past yr: 0 0 0 0 0  Injury with Fall? 0 0 0 0 0  Comment - - - - -  Follow up - - Falls evaluation completed - -     Assessment & Plan  1. Mild episode of recurrent major depressive disorder (HCC)  - traZODone (DESYREL) 100 MG tablet; Take 1 tablet (100 mg total) by mouth at bedtime.  Dispense: 90 tablet; Refill: 1  2. Major depression in remission (HCC)  - venlafaxine XR (EFFEXOR-XR) 150 MG 24 hr capsule; Take 1 capsule (150 mg total) by mouth daily.  Dispense: 90 capsule; Refill: 0  3. Dyslipidemia  - atorvastatin (LIPITOR) 40 MG tablet; TAKE 1 TABLET(40 MG) BY MOUTH DAILY  Dispense: 90 tablet; Refill: 0   I discussed the assessment and treatment plan with the patient. The patient was provided an opportunity to ask questions and all were answered. The patient agreed with the plan and demonstrated  an understanding of the instructions.  The patient was advised to call back or seek an in-person evaluation if the symptoms worsen or if the condition fails to improve as anticipated.  I provided 25  minutes of non-face-to-face time during this encounter.

## 2021-08-31 ENCOUNTER — Ambulatory Visit: Payer: Managed Care, Other (non HMO) | Admitting: Family Medicine

## 2021-08-31 ENCOUNTER — Telehealth (INDEPENDENT_AMBULATORY_CARE_PROVIDER_SITE_OTHER): Payer: Managed Care, Other (non HMO) | Admitting: Family Medicine

## 2021-08-31 ENCOUNTER — Encounter: Payer: Self-pay | Admitting: Family Medicine

## 2021-08-31 DIAGNOSIS — F325 Major depressive disorder, single episode, in full remission: Secondary | ICD-10-CM

## 2021-08-31 DIAGNOSIS — F330 Major depressive disorder, recurrent, mild: Secondary | ICD-10-CM

## 2021-08-31 DIAGNOSIS — E785 Hyperlipidemia, unspecified: Secondary | ICD-10-CM

## 2021-08-31 MED ORDER — ATORVASTATIN CALCIUM 40 MG PO TABS: ORAL_TABLET | ORAL | 0 refills | Status: DC

## 2021-08-31 MED ORDER — TRAZODONE HCL 100 MG PO TABS: 100.0000 mg | ORAL_TABLET | Freq: Every day | ORAL | 1 refills | Status: DC

## 2021-08-31 MED ORDER — VENLAFAXINE HCL ER 150 MG PO CP24: 150.0000 mg | ORAL_CAPSULE | Freq: Every day | ORAL | 0 refills | Status: DC

## 2021-10-07 ENCOUNTER — Encounter: Payer: Self-pay | Admitting: Family Medicine

## 2021-10-09 ENCOUNTER — Telehealth: Payer: Self-pay

## 2021-10-09 ENCOUNTER — Other Ambulatory Visit: Payer: Self-pay

## 2021-10-09 DIAGNOSIS — G4733 Obstructive sleep apnea (adult) (pediatric): Secondary | ICD-10-CM

## 2021-10-09 NOTE — Telephone Encounter (Signed)
Spoke with Claiborne Billings, she stated it has been many years since Patient has had a sleep study. New order was placed today per Dr. Ancil Boozer' advice. Once complete, we will write RX for CPAP machine (portable) and supplies.

## 2021-11-20 ENCOUNTER — Telehealth: Payer: Self-pay

## 2021-11-20 NOTE — Telephone Encounter (Signed)
Pt would like a call about a different home sleep study. Pt needs this call as soon as possible. Call [phone]

## 2021-11-21 ENCOUNTER — Other Ambulatory Visit: Payer: Self-pay

## 2021-11-21 DIAGNOSIS — G4733 Obstructive sleep apnea (adult) (pediatric): Secondary | ICD-10-CM

## 2021-11-21 DIAGNOSIS — I10 Essential (primary) hypertension: Secondary | ICD-10-CM

## 2021-11-25 ENCOUNTER — Encounter: Payer: Self-pay | Admitting: Family Medicine

## 2021-11-26 ENCOUNTER — Other Ambulatory Visit: Payer: Self-pay | Admitting: Family Medicine

## 2021-11-26 ENCOUNTER — Encounter: Payer: Self-pay | Admitting: Family Medicine

## 2021-11-26 DIAGNOSIS — N529 Male erectile dysfunction, unspecified: Secondary | ICD-10-CM

## 2021-11-26 MED ORDER — SILDENAFIL CITRATE 100 MG PO TABS: 100.0000 mg | ORAL_TABLET | Freq: Every day | ORAL | 0 refills | Status: DC | PRN

## 2021-12-05 ENCOUNTER — Other Ambulatory Visit: Payer: Self-pay

## 2021-12-05 ENCOUNTER — Telehealth: Payer: Self-pay

## 2021-12-05 DIAGNOSIS — G4733 Obstructive sleep apnea (adult) (pediatric): Secondary | ICD-10-CM

## 2021-12-05 NOTE — Telephone Encounter (Signed)
Order in EPIC 

## 2021-12-05 NOTE — Telephone Encounter (Signed)
Copied from [place] [phone]. Topic: Referral - Status >> Dec 05, 2021 12:41 PM Alanda Slim E wrote: Reason for CRM: [facility] needs new referral for at home sleep study due to in lab study being denied by insurance / please send asap / fax# [phone]/ Erline Levine from [facility] sleep study stated pt needs this done asap of he will lose his Job/ please contact Erline Levine for any questions

## 2021-12-05 NOTE — Telephone Encounter (Signed)
Order placed in Epic.

## 2021-12-08 ENCOUNTER — Other Ambulatory Visit: Payer: Self-pay | Admitting: Family Medicine

## 2021-12-08 DIAGNOSIS — F330 Major depressive disorder, recurrent, mild: Secondary | ICD-10-CM

## 2021-12-12 ENCOUNTER — Ambulatory Visit: Payer: Managed Care, Other (non HMO) | Attending: Otolaryngology

## 2021-12-12 DIAGNOSIS — I10 Essential (primary) hypertension: Secondary | ICD-10-CM | POA: Diagnosis not present

## 2021-12-12 DIAGNOSIS — K219 Gastro-esophageal reflux disease without esophagitis: Secondary | ICD-10-CM | POA: Diagnosis not present

## 2021-12-12 DIAGNOSIS — E669 Obesity, unspecified: Secondary | ICD-10-CM | POA: Diagnosis not present

## 2021-12-14 ENCOUNTER — Other Ambulatory Visit: Payer: Self-pay

## 2021-12-17 ENCOUNTER — Encounter: Payer: Self-pay | Admitting: Family Medicine

## 2021-12-26 ENCOUNTER — Encounter: Payer: Self-pay | Admitting: Family Medicine

## 2021-12-28 ENCOUNTER — Ambulatory Visit: Payer: Managed Care, Other (non HMO) | Admitting: Family Medicine

## 2022-01-04 ENCOUNTER — Ambulatory Visit: Payer: Managed Care, Other (non HMO) | Admitting: Nurse Practitioner

## 2022-01-25 ENCOUNTER — Encounter: Payer: Self-pay | Admitting: Nurse Practitioner

## 2022-01-25 ENCOUNTER — Ambulatory Visit: Payer: Managed Care, Other (non HMO) | Admitting: Nurse Practitioner

## 2022-01-25 VITALS — BP 132/68 | HR 93 | Resp 16 | Ht 70.0 in | Wt 281.0 lb

## 2022-01-25 DIAGNOSIS — E785 Hyperlipidemia, unspecified: Secondary | ICD-10-CM | POA: Diagnosis not present

## 2022-01-25 DIAGNOSIS — E1140 Type 2 diabetes mellitus with diabetic neuropathy, unspecified: Secondary | ICD-10-CM | POA: Diagnosis not present

## 2022-01-25 DIAGNOSIS — I10 Essential (primary) hypertension: Secondary | ICD-10-CM

## 2022-01-25 DIAGNOSIS — N529 Male erectile dysfunction, unspecified: Secondary | ICD-10-CM

## 2022-01-25 DIAGNOSIS — F330 Major depressive disorder, recurrent, mild: Secondary | ICD-10-CM

## 2022-01-25 DIAGNOSIS — G4726 Circadian rhythm sleep disorder, shift work type: Secondary | ICD-10-CM

## 2022-01-25 DIAGNOSIS — K219 Gastro-esophageal reflux disease without esophagitis: Secondary | ICD-10-CM

## 2022-01-25 MED ORDER — ATORVASTATIN CALCIUM 40 MG PO TABS: ORAL_TABLET | ORAL | 1 refills | Status: DC

## 2022-01-25 MED ORDER — TRAZODONE HCL 100 MG PO TABS: 100.0000 mg | ORAL_TABLET | Freq: Every day | ORAL | 1 refills | Status: DC

## 2022-01-25 MED ORDER — VENLAFAXINE HCL ER 150 MG PO CP24: 150.0000 mg | ORAL_CAPSULE | Freq: Every day | ORAL | 0 refills | Status: DC

## 2022-01-25 MED ORDER — SILDENAFIL CITRATE 100 MG PO TABS: 100.0000 mg | ORAL_TABLET | Freq: Every day | ORAL | 1 refills | Status: DC | PRN

## 2022-01-25 NOTE — Progress Notes (Signed)
? ?BP 132/68   Pulse 93   Resp 16   Ht '5\' 10"'  (1.778 m)   Wt 281 lb (127.5 kg)   SpO2 98%   BMI 40.32 kg/m?   ? ?Subjective:  ? ? Patient ID: Jimmy Bryant, male    DOB: 10-21-1971, 51 y.o.   MRN: [id number] ? ?HPI: ?Jimmy Bryant is a 51 y.o. male ? ?Chief Complaint  ?Patient presents with  ? Medication Refill  ? ?DM:He has been off medication since he head bariatric surgery on 11/09/2018.  He denies any polyuria, polydipsia or polyphagia.  His last A1C was 5.1 om 05/25/2021. ? ?HTN: He has been off medication since he had bariatric surgery on 11/09/2018.  He says he is doing well. His blood pressure today is 132/68.  He denies any chest pain, shortness of breath, headaches or blurred vision.  ? ?Hyperlipidemia:His last LDL was 60 on 06/18/2021.  He is currently taking atorvastatin 40 mg daily. He denies any myalgia.  Will continue current treatment and get labs.  ? ?GERD: he says his acid reflux is well controlled and occasionally takes an over the counter acid reducer if needed.  ? ?Depression/ sleep disorder: He is currently taking venlafaxine 150 mg daily.  He says he is doing well with his mental health and denies any suicidal thoughts.  He does use trazodone 100 mg at bedtime and it helps him sleep.  ? ?  01/25/2022  ? 10:11 AM 08/31/2021  ? 11:25 AM 07/11/2021  ? 10:34 AM 05/25/2021  ?  2:43 PM 08/18/2020  ?  3:09 PM  ?Depression screen PHQ 2/9  ?Decreased Interest 1 0 0 0 0  ?Down, Depressed, Hopeless 0 0 0 0 0  ?PHQ - 2 Score 1 0 0 0 0  ?Altered sleeping 0 0 0 0   ?Tired, decreased energy 0 0 0 0   ?Change in appetite 0 0 0 0   ?Feeling bad or failure about yourself  0 0 0 0   ?Trouble concentrating 0 0 0 0   ?Moving slowly or fidgety/restless 0 0 0 0   ?Suicidal thoughts 0 0 0 0   ?PHQ-9 Score 1 0 0 0   ?Difficult doing work/chores  Not difficult at all  Not difficult at all   ?  ?ED: He uses viagra as needed for sexual activity. He denies any complications.  Refill sent. ? ?Relevant past medical,  surgical, family and social history reviewed and updated as indicated. Interim medical history since our last visit reviewed. ?Allergies and medications reviewed and updated. ? ?Review of Systems ? ?Constitutional: Negative for fever or weight change.  ?Respiratory: Negative for cough and shortness of breath.   ?Cardiovascular: Negative for chest pain or palpitations.  ?Gastrointestinal: Negative for abdominal pain, no bowel changes.  ?Musculoskeletal: Negative for gait problem or joint swelling.  ?Skin: Negative for rash.  ?Neurological: Negative for dizziness or headache.  ?No other specific complaints in a complete review of systems (except as listed in HPI above).  ? ?   ?Objective:  ?  ?BP 132/68   Pulse 93   Resp 16   Ht '5\' 10"'  (1.778 m)   Wt 281 lb (127.5 kg)   SpO2 98%   BMI 40.32 kg/m?   ?Wt Readings from Last 3 Encounters:  ?01/25/22 281 lb (127.5 kg)  ?08/31/21 279 lb (126.6 kg)  ?07/11/21 279 lb 6.4 oz (126.7 kg)  ?  ?Physical Exam ? ?Constitutional: Patient appears well-developed  and well-nourished. Obese  No distress.  ?HEENT: head atraumatic, normocephalic, pupils equal and reactive to light, neck supple ?Cardiovascular: Normal rate, regular rhythm and normal heart sounds.  No murmur heard. No BLE edema. ?Pulmonary/Chest: Effort normal and breath sounds normal. No respiratory distress. ?Abdominal: Soft.  There is no tenderness. ?Psychiatric: Patient has a normal mood and affect. behavior is normal. Judgment and thought content normal.  ? ?Results for orders placed or performed in visit on 05/25/21  ?Lipid Profile  ?Result Value Ref Range  ? Cholesterol, Total 115 100 - 199 mg/dL  ? Triglycerides 94 0 - 149 mg/dL  ? HDL 37 (L) >39 mg/dL  ? VLDL Cholesterol Cal 18 5 - 40 mg/dL  ? LDL Chol Calc (NIH) 60 0 - 99 mg/dL  ? Chol/HDL Ratio 3.1 0.0 - 5.0 ratio  ?Hepatitis C Antibody  ?Result Value Ref Range  ? Hep C Virus Ab <0.1 0.0 - 0.9 s/co ratio  ?Vitamin B12  ?Result Value Ref Range  ? Vitamin B-12  399 232 - 1,245 pg/mL  ?VITAMIN D 25 Hydroxy (Vit-D Deficiency, Fractures)  ?Result Value Ref Range  ? Vit D, 25-Hydroxy 39.3 30.0 - 100.0 ng/mL  ?Iron, TIBC and Ferritin Panel  ?Result Value Ref Range  ? Total Iron Binding Capacity 283 250 - 450 ug/dL  ? UIBC 218 111 - 343 ug/dL  ? Iron 65 38 - 169 ug/dL  ? Iron Saturation 23 15 - 55 %  ? Ferritin 94 30 - 400 ng/mL  ?CBC with Differential/Platelet  ?Result Value Ref Range  ? WBC 5.9 3.4 - 10.8 x10E3/uL  ? RBC 4.91 4.14 - 5.80 x10E6/uL  ? Hemoglobin 13.6 13.0 - 17.7 g/dL  ? Hematocrit 41.4 37.5 - 51.0 %  ? MCV 84 79 - 97 fL  ? MCH 27.7 26.6 - 33.0 pg  ? MCHC 32.9 31.5 - 35.7 g/dL  ? RDW 13.1 11.6 - 15.4 %  ? Platelets 263 150 - 450 x10E3/uL  ? Neutrophils 58 Not Estab. %  ? Lymphs 30 Not Estab. %  ? Monocytes 8 Not Estab. %  ? Eos 3 Not Estab. %  ? Basos 1 Not Estab. %  ? Neutrophils Absolute 3.4 1.4 - 7.0 x10E3/uL  ? Lymphocytes Absolute 1.8 0.7 - 3.1 x10E3/uL  ? Monocytes Absolute 0.5 0.1 - 0.9 x10E3/uL  ? EOS (ABSOLUTE) 0.2 0.0 - 0.4 x10E3/uL  ? Basophils Absolute 0.1 0.0 - 0.2 x10E3/uL  ? Immature Granulocytes 0 Not Estab. %  ? Immature Grans (Abs) 0.0 0.0 - 0.1 x10E3/uL  ?Microalbumin / creatinine urine ratio  ?Result Value Ref Range  ? Creatinine, Urine 113.1 Not Estab. mg/dL  ? Microalbumin, Urine 8.7 Not Estab. ug/mL  ? Microalb/Creat Ratio 8 0 - 29 mg/g creat  ?Comprehensive metabolic panel  ?Result Value Ref Range  ? Glucose 95 65 - 99 mg/dL  ? BUN 9 6 - 24 mg/dL  ? Creatinine, Ser 0.99 0.76 - 1.27 mg/dL  ? eGFR 92 >59 mL/min/1.73  ? BUN/Creatinine Ratio 9 9 - 20  ? Sodium 141 134 - 144 mmol/L  ? Potassium 4.8 3.5 - 5.2 mmol/L  ? Chloride 101 96 - 106 mmol/L  ? CO2 26 20 - 29 mmol/L  ? Calcium 9.4 8.7 - 10.2 mg/dL  ? Total Protein 7.0 6.0 - 8.5 g/dL  ? Albumin 4.3 3.8 - 4.9 g/dL  ? Globulin, Total 2.7 1.5 - 4.5 g/dL  ? Albumin/Globulin Ratio 1.6 1.2 - 2.2  ? Bilirubin  Total 0.4 0.0 - 1.2 mg/dL  ? Alkaline Phosphatase 93 44 - 121 IU/L  ? AST 20 0 - 40 IU/L   ? ALT 23 0 - 44 IU/L  ?POCT HgB A1C  ?Result Value Ref Range  ? Hemoglobin A1C 5.1 4.0 - 5.6 %  ? HbA1c POC (<> result, manual entry)    ? HbA1c, POC (prediabetic range)    ? HbA1c, POC (controlled diabetic range)    ? ?   ?Assessment & Plan:  ? ?1. Controlled type 2 diabetes with neuropathy ([place]) ? ?- HgB A1c ?- Comprehensive metabolic panel ? ?2. Hypertension, benign ? ?- CBC with Differential/Platelet ?- Comprehensive metabolic panel ? ?3. Dyslipidemia ? ?- Lipid panel ?- atorvastatin (LIPITOR) 40 MG tablet; TAKE 1 TABLET(40 MG) BY MOUTH DAILY  Dispense: 90 tablet; Refill: 1 ?- Comprehensive metabolic panel ? ?4. Mild episode of recurrent major depressive disorder ([place]) ? ?- venlafaxine XR (EFFEXOR-XR) 150 MG 24 hr capsule; Take 1 capsule (150 mg total) by mouth daily.  Dispense: 90 capsule; Refill: 0 ?- traZODone (DESYREL) 100 MG tablet; Take 1 tablet (100 mg total) by mouth at bedtime.  Dispense: 90 tablet; Refill: 1 ? ?5. Circadian rhythm sleep disorder, shift work type ? ?- traZODone (DESYREL) 100 MG tablet; Take 1 tablet (100 mg total) by mouth at bedtime.  Dispense: 90 tablet; Refill: 1 ? ?6. Gastroesophageal reflux disease without esophagitis ?-continue prn use of antacid ? ?7. ED (erectile dysfunction) of organic origin ? ?- sildenafil (VIAGRA) 100 MG tablet; Take 1 tablet (100 mg total) by mouth daily as needed for erectile dysfunction.  Dispense: 30 tablet; Refill: 1  ? ?Follow up plan: ?Return in about 6 months (around 07/27/2022) for follow up. ? ? ? ? ? ?

## 2022-01-26 LAB — CBC WITH DIFFERENTIAL/PLATELET
Basophils Absolute: 0.1 10*3/uL (ref 0.0–0.2)
Basos: 1 %
EOS (ABSOLUTE): 0.1 10*3/uL (ref 0.0–0.4)
Eos: 2 %
Hematocrit: 39.9 % (ref 37.5–51.0)
Hemoglobin: 13.6 g/dL (ref 13.0–17.7)
Immature Grans (Abs): 0 10*3/uL (ref 0.0–0.1)
Immature Granulocytes: 0 %
Lymphocytes Absolute: 1.9 10*3/uL (ref 0.7–3.1)
Lymphs: 31 %
MCH: 28.1 pg (ref 26.6–33.0)
MCHC: 34.1 g/dL (ref 31.5–35.7)
MCV: 82 fL (ref 79–97)
Monocytes Absolute: 0.7 10*3/uL (ref 0.1–0.9)
Monocytes: 11 %
Neutrophils Absolute: 3.3 10*3/uL (ref 1.4–7.0)
Neutrophils: 55 %
Platelets: 281 10*3/uL (ref 150–450)
RBC: 4.84 x10E6/uL (ref 4.14–5.80)
RDW: 13.2 % (ref 11.6–15.4)
WBC: 6.1 10*3/uL (ref 3.4–10.8)

## 2022-01-26 LAB — COMPREHENSIVE METABOLIC PANEL
ALT: 24 IU/L (ref 0–44)
AST: 19 IU/L (ref 0–40)
Albumin/Globulin Ratio: 1.5 (ref 1.2–2.2)
Albumin: 4.3 g/dL (ref 3.8–4.9)
Alkaline Phosphatase: 102 IU/L (ref 44–121)
BUN/Creatinine Ratio: 13 (ref 9–20)
BUN: 13 mg/dL (ref 6–24)
Bilirubin Total: 0.4 mg/dL (ref 0.0–1.2)
CO2: 27 mmol/L (ref 20–29)
Calcium: 9.2 mg/dL (ref 8.7–10.2)
Chloride: 102 mmol/L (ref 96–106)
Creatinine, Ser: 1.03 mg/dL (ref 0.76–1.27)
Globulin, Total: 2.8 g/dL (ref 1.5–4.5)
Glucose: 42 mg/dL — ABNORMAL LOW (ref 70–99)
Potassium: 4.4 mmol/L (ref 3.5–5.2)
Sodium: 141 mmol/L (ref 134–144)
Total Protein: 7.1 g/dL (ref 6.0–8.5)
eGFR: 87 mL/min/{1.73_m2} (ref >59)

## 2022-01-26 LAB — HEMOGLOBIN A1C
Est. average glucose Bld gHb Est-mCnc: 111 mg/dL
Hgb A1c MFr Bld: 5.5 % (ref 4.8–5.6)

## 2022-01-26 LAB — LIPID PANEL
Chol/HDL Ratio: 3.4 ratio (ref 0.0–5.0)
Cholesterol, Total: 122 mg/dL (ref 100–199)
HDL: 36 mg/dL — ABNORMAL LOW (ref >39)
LDL Chol Calc (NIH): 65 mg/dL (ref 0–99)
Triglycerides: 115 mg/dL (ref 0–149)
VLDL Cholesterol Cal: 21 mg/dL (ref 5–40)

## 2022-06-09 ENCOUNTER — Other Ambulatory Visit: Payer: Self-pay | Admitting: Nurse Practitioner

## 2022-06-09 DIAGNOSIS — F330 Major depressive disorder, recurrent, mild: Secondary | ICD-10-CM

## 2022-06-10 NOTE — Telephone Encounter (Signed)
Requested Prescriptions  Pending Prescriptions Disp Refills  . venlafaxine XR (EFFEXOR-XR) 150 MG 24 hr capsule [Pharmacy Med Name: VENLAFAXINE ER '150MG'$  CAPSULES] 90 capsule 0    Sig: TAKE 1 CAPSULE(150 MG) BY MOUTH DAILY     Psychiatry: Antidepressants - SNRI - desvenlafaxine & venlafaxine Failed - 06/09/2022  9:15 AM      Failed - Lipid Panel in normal range within the last 12 months    Cholesterol, Total  Date Value Ref Range Status  01/25/2022 122 100 - 199 mg/dL Final   LDL Chol Calc (NIH)  Date Value Ref Range Status  01/25/2022 65 0 - 99 mg/dL Final   HDL  Date Value Ref Range Status  01/25/2022 36 (L) >39 mg/dL Final   Triglycerides  Date Value Ref Range Status  01/25/2022 115 0 - 149 mg/dL Final         Passed - Cr in normal range and within 360 days    Creat  Date Value Ref Range Status  05/31/2016 1.00 0.60 - 1.35 mg/dL Final   Creatinine, Ser  Date Value Ref Range Status  01/25/2022 1.03 0.76 - 1.27 mg/dL Final   Creatinine, Urine  Date Value Ref Range Status  10/11/2019 104 20 - 320 mg/dL Final         Passed - Completed PHQ-2 or PHQ-9 in the last 360 days      Passed - Last BP in normal range    BP Readings from Last 1 Encounters:  01/25/22 132/68         Passed - Valid encounter within last 6 months    Recent Outpatient Visits          4 months ago Controlled type 2 diabetes with neuropathy [facility])   [facility] Bo Merino, FNP   9 months ago Mild episode of recurrent major depressive disorder [facility])   [facility] Steele Sizer, MD   11 months ago Mild episode of recurrent major depressive disorder [facility])   [facility] Steele Sizer, MD   1 year ago Controlled type 2 diabetes with neuropathy [facility])   [facility] Steele Sizer, MD   1 year ago Controlled type 2 diabetes with neuropathy [facility])   [facility] Steele Sizer, MD      Future  Appointments            In 1 month Ancil Boozer, Drue Stager, MD [facility], [facility]

## 2022-08-01 NOTE — Progress Notes (Signed)
Name: Jimmy Bryant   MRN: [id number]    DOB: 1971/03/11   Date:08/02/2022       Progress Note  Subjective  Chief Complaint  Follow Up  HPI  Morbid obesity: he had gastric bypass surgery on 11/09/2018 done at [facility] by Dr. Shawna Orleans. He tolerated procedure well, at the day of surgery his weight was 313 lbs he was stable 230's -234 lbs for months however gradually gaining weight back, today weight has been been in the 270 's range.  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  No dumping symdrome.  His weight needs to be 208 lbs, for his BMI to go  below 35 .     DM with ED: he has been off medication since bariatric surgery on 11/09/18, weight is down 10 lbs since last visit, she is snacking healthier in his truck  He denies polyphagia, polydipsia or polyuria. ED is controlled with Viagra  Diabetes associated with obesity and HTN.   HTN: BP has been above 130 for a while we will resume Losartan, no chest pain, dizziness  or palpitation .   ED: he has low libido, takes Viagra and able to sustain an erection, sometimes does not reach an orgasm.   GERD: he is doing well, no longer having problems. 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 Pressure still at 9 .He has difficulty   Major Depression: he was on Effexor 150 mg and  Abilify 5 mg and was doing well emotionally however he developed symptoms of tardive dyskinesia such as biting his tongue, also involuntary movements on his neck and sometimes on his eye lids.  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   Mild episode of recurrent major depressive disorder ([place]) 12/29/2015   Dyslipidemia associated with type 2 diabetes mellitus ([place]) 04/30/2015   Circadian rhythm sleep disorder, shift work type 04/23/2015   Dyslipidemia 04/23/2015   H/O lumbosacral spine surgery 89/21/1941   H/O umbilical hernia repair 74/05/1447   Dysmetabolic syndrome 18/56/3149   Morbid (severe) obesity due to excess calories ([place]) 04/23/2015   ED (erectile dysfunction) of organic origin 04/23/2015   Obstructive apnea 04/23/2015   Vitamin D deficiency 04/23/2015   Decreased testosterone level 04/23/2015   Gastro-esophageal reflux disease without esophagitis 08/22/2009    Past Surgical History:  Procedure Laterality Date   COLONOSCOPY WITH PROPOFOL N/A 08/13/2019   Procedure: COLONOSCOPY WITH BIOPSY;  Surgeon: Lucilla Lame, MD;  Location: [facility];  Service: Endoscopy;  Laterality: N/A;   microdecompression lumbar spine  02/2011   L4-5 and L5-S1 by Dr. Babette Relic   POLYPECTOMY N/A 08/13/2019   Procedure: POLYPECTOMY;  Surgeon: Lucilla Lame, MD;  Location: [place] [facility];  Service: Endoscopy;  Laterality: N/A;   UMBILICAL HERNIA REPAIR  07/10/2011    Family History  Problem Relation Age of Onset   Diabetes Mother    Hypertension Father    CAD Father    Hypertension Sister    Hypertension Daughter    ADD / ADHD Son     Social History   Tobacco Use   Smoking status: Former    Packs/day: 1.00    Years: 31.00    Total pack years: 31.00    Types: Cigarettes    Start date: 10/28/1980    Quit date: 06/28/2012    Years since quitting: 10.1   Smokeless tobacco: Never  Substance Use Topics   Alcohol use: Yes    Alcohol/week: 0.0 standard drinks of alcohol    Comment:  rarely-a beer a month     Current Outpatient Medications:    losartan (COZAAR) 25 MG tablet, Take 1 tablet (25 mg total) by mouth daily., Disp: 90 tablet, Rfl: 1   QUEtiapine (SEROQUEL) 25 MG tablet, Take 1 tablet (25 mg total) by mouth at bedtime., Disp: 90 tablet, Rfl: 1   atorvastatin (LIPITOR) 40 MG tablet, TAKE 1 TABLET(40 MG) BY MOUTH DAILY, Disp: 90 tablet, Rfl: 1   sildenafil (VIAGRA) 100 MG tablet, Take 1 tablet (100 mg total) by mouth daily as needed for erectile dysfunction., Disp: 30 tablet, Rfl: 1   venlafaxine XR (EFFEXOR-XR) 150 MG 24 hr capsule, Take 1 capsule (150 mg total) by mouth daily with breakfast., Disp: 90 capsule, Rfl: 1  Allergies  Allergen Reactions   Oxycodone-Acetaminophen Itching   Trulicity [Dulaglutide] Nausea Only    And diarrhea    I personally reviewed active problem list, medication list, allergies, family history, social history, health maintenance with the patient/caregiver today.   ROS  Constitutional: Negative for fever , positive for mild weight change.  Respiratory: Negative for cough and shortness of breath.   Cardiovascular: Negative for chest pain or palpitations.  Gastrointestinal: Negative for abdominal pain, no bowel changes.  Musculoskeletal: Negative for gait problem or joint swelling.  Skin: Negative for rash.  Neurological: Negative for dizziness or headache.  No other specific complaints in a complete review of systems (except as listed in HPI above).   Objective  Vitals:   08/02/22 0809  BP: 132/78  Pulse: 79  Resp: 16  SpO2: 99%  Weight: 271 lb (122.9 kg)  Height: '5\' 11"'$  (1.803 m)    Body mass index is 37.8 kg/m.  Physical Exam  Constitutional: Patient appears well-developed and well-nourished. Obese  No distress.  HEENT: head atraumatic, normocephalic, pupils equal and reactive to light, neck supple, throat within normal limits Cardiovascular: Normal rate, regular rhythm and normal heart sounds.  No murmur  heard. No BLE edema. Pulmonary/Chest: Effort normal and breath sounds normal. No respiratory distress. Abdominal: Soft.  There is no tenderness. Psychiatric: Patient has a normal mood and affect. behavior is normal. Judgment and thought content normal.   Recent Results (from the past 2160 hour(s))  POCT HgB A1C     Status: None   Collection Time: 08/02/22  8:11 AM  Result Value Ref Range   Hemoglobin A1C 5.3 4.0 - 5.6 %   HbA1c POC (<> result, manual entry)     HbA1c, POC (prediabetic range)     HbA1c, POC (controlled diabetic range)      Diabetic Foot Exam: Diabetic Foot Exam - Simple   Simple Foot Form Visual Inspection No deformities, no ulcerations, no other  skin breakdown bilaterally: Yes Sensation Testing Intact to touch and monofilament testing bilaterally: Yes Pulse Check Posterior Tibialis and Dorsalis pulse intact bilaterally: Yes Comments      PHQ2/9:    08/02/2022    8:10 AM 01/25/2022   10:11 AM 08/31/2021   11:25 AM 07/11/2021   10:34 AM 05/25/2021    2:43 PM  Depression screen PHQ 2/9  Decreased Interest 2 1 0 0 0  Down, Depressed, Hopeless 0 0 0 0 0  PHQ - 2 Score 2 1 0 0 0  Altered sleeping 3 0 0 0 0  Tired, decreased energy 0 0 0 0 0  Change in appetite 0 0 0 0 0  Feeling bad or failure about yourself  0 0 0 0 0  Trouble concentrating 0 0 0 0 0  Moving slowly or fidgety/restless 0 0 0 0 0  Suicidal thoughts 0 0 0 0 0  PHQ-9 Score 5 1 0 0 0  Difficult doing work/chores   Not difficult at all  Not difficult at all    phq 9 is positive   Fall Risk:    08/02/2022    8:10 AM 01/25/2022   10:11 AM 08/31/2021   11:25 AM 07/11/2021   10:34 AM 05/25/2021    2:42 PM  Fall Risk   Falls in the past year? 0 0 0 0 0  Number falls in past yr: 0 0 0 0 0  Injury with Fall? 0 0 0 0 0  Risk for fall due to : No Fall Risks No Fall Risks     Follow up Falls prevention discussed Falls prevention discussed         Functional Status Survey: Is the patient deaf  or have difficulty hearing?: No Does the patient have difficulty seeing, even when wearing glasses/contacts?: No Does the patient have difficulty concentrating, remembering, or making decisions?: No Does the patient have difficulty walking or climbing stairs?: No Does the patient have difficulty dressing or bathing?: No Does the patient have difficulty doing errands alone such as visiting a doctor's office or shopping?: No    Assessment & Plan  1. Controlled type 2 diabetes with neuropathy (HCC)  - POCT HgB A1C - HM Diabetes Foot Exam - Urine Microalbumin w/creat. ratio  2. Need for immunization against influenza  - Flu Vaccine QUAD 6+ mos PF IM (Fluarix Quad PF)  3. Dyslipidemia  - atorvastatin (LIPITOR) 40 MG tablet; TAKE 1 TABLET(40 MG) BY MOUTH DAILY  Dispense: 90 tablet; Refill: 1  4. ED (erectile dysfunction) of organic origin  - sildenafil (VIAGRA) 100 MG tablet; Take 1 tablet (100 mg total) by mouth daily as needed for erectile dysfunction.  Dispense: 30 tablet; Refill: 1  5. Mild episode of recurrent major depressive disorder (HCC)  - venlafaxine XR (EFFEXOR-XR) 150 MG 24 hr capsule; Take 1 capsule (150 mg total) by mouth daily with breakfast.  Dispense: 90 capsule; Refill: 1  6. Gastroesophageal reflux disease without esophagitis   7. History of bariatric surgery   8. Insomnia with sleep apnea  - QUEtiapine (SEROQUEL) 25 MG tablet; Take 1 tablet (25 mg total) by mouth at bedtime.  Dispense: 90 tablet; Refill: 1  9. Hypertension, benign  - losartan (COZAAR) 25 MG tablet; Take 1 tablet (25 mg total) by mouth daily.  Dispense: 90 tablet; Refill: 1   10. Need for shingles vaccine  - Varicella-zoster vaccine IM

## 2022-08-02 ENCOUNTER — Ambulatory Visit: Payer: Managed Care, Other (non HMO) | Admitting: Family Medicine

## 2022-08-02 ENCOUNTER — Encounter: Payer: Self-pay | Admitting: Family Medicine

## 2022-08-02 VITALS — BP 132/78 | HR 79 | Resp 16 | Ht 71.0 in | Wt 271.0 lb

## 2022-08-02 DIAGNOSIS — F330 Major depressive disorder, recurrent, mild: Secondary | ICD-10-CM

## 2022-08-02 DIAGNOSIS — G4730 Sleep apnea, unspecified: Secondary | ICD-10-CM

## 2022-08-02 DIAGNOSIS — I10 Essential (primary) hypertension: Secondary | ICD-10-CM

## 2022-08-02 DIAGNOSIS — Z23 Encounter for immunization: Secondary | ICD-10-CM | POA: Diagnosis not present

## 2022-08-02 DIAGNOSIS — Z9884 Bariatric surgery status: Secondary | ICD-10-CM

## 2022-08-02 DIAGNOSIS — K219 Gastro-esophageal reflux disease without esophagitis: Secondary | ICD-10-CM

## 2022-08-02 DIAGNOSIS — E1140 Type 2 diabetes mellitus with diabetic neuropathy, unspecified: Secondary | ICD-10-CM

## 2022-08-02 DIAGNOSIS — G4700 Insomnia, unspecified: Secondary | ICD-10-CM

## 2022-08-02 DIAGNOSIS — N529 Male erectile dysfunction, unspecified: Secondary | ICD-10-CM | POA: Diagnosis not present

## 2022-08-02 DIAGNOSIS — E785 Hyperlipidemia, unspecified: Secondary | ICD-10-CM

## 2022-08-02 LAB — POCT GLYCOSYLATED HEMOGLOBIN (HGB A1C): Hemoglobin A1C: 5.3 % (ref 4.0–5.6)

## 2022-08-02 MED ORDER — QUETIAPINE FUMARATE 25 MG PO TABS: 25.0000 mg | ORAL_TABLET | Freq: Every day | ORAL | 1 refills | Status: DC

## 2022-08-02 MED ORDER — VENLAFAXINE HCL ER 150 MG PO CP24: 150.0000 mg | ORAL_CAPSULE | Freq: Every day | ORAL | 1 refills | Status: DC

## 2022-08-02 MED ORDER — ATORVASTATIN CALCIUM 40 MG PO TABS: ORAL_TABLET | ORAL | 1 refills | Status: DC

## 2022-08-02 MED ORDER — SILDENAFIL CITRATE 100 MG PO TABS: 100.0000 mg | ORAL_TABLET | Freq: Every day | ORAL | 1 refills | Status: DC | PRN

## 2022-08-02 MED ORDER — LOSARTAN POTASSIUM 25 MG PO TABS: 25.0000 mg | ORAL_TABLET | Freq: Every day | ORAL | 1 refills | Status: DC

## 2022-08-03 LAB — MICROALBUMIN / CREATININE URINE RATIO
Creatinine, Urine: 138 mg/dL (ref 20–320)
Microalb Creat Ratio: 3 mcg/mg creat (ref <30)
Microalb, Ur: 0.4 mg/dL

## 2022-08-19 ENCOUNTER — Other Ambulatory Visit: Payer: Self-pay | Admitting: Family Medicine

## 2022-08-19 ENCOUNTER — Encounter: Payer: Self-pay | Admitting: Family Medicine

## 2022-08-19 DIAGNOSIS — N529 Male erectile dysfunction, unspecified: Secondary | ICD-10-CM

## 2022-08-19 DIAGNOSIS — R6882 Decreased libido: Secondary | ICD-10-CM

## 2023-02-10 NOTE — Progress Notes (Unsigned)
Name: Jimmy Bryant   MRN: [id number]    DOB: 10-14-1971   Date:02/11/2023       Progress Note  Subjective  Chief Complaint  Follow Up  HPI  Morbid obesity: he had gastric bypass surgery on 11/09/2018 done at [facility] by Dr. Artist Pais. He tolerated procedure well, at the day of surgery his weight was 313 lbs he was stable 230's -234 lbs for months however gradually gaining weight back, his weight spiked to 281 lbs Spring of 2023 , he changed his diet and was down to 271 lbs Fall of 2023 , today his weight is 237 lbs. He states no longer staying oer night on his job site, diet has improved, his son has celiac disease and they eat a lot of chicken and vegetables. Not snacking all the time. He is eating smaller portion. Denies abdominal pain, change in bowel movements, his energy level is good. We will check labs and monitor for now. Colonoscopy is up to date     DM with ED: he has been off medication since bariatric surgery on 11/09/18, weight is down 34 lbs and is doing well.  He denies polyphagia, polydipsia or polyuria. 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   ED: he has low libido, takes Viagra and able to sustain an erection, sometimes does not reach an orgasm.   GERD: he is doing well, no longer having problems. 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 Pressure is 9 cm H2O   Major Depression: he was on Effexor 150 mg and  Abilify 5 mg and was  doing well emotionally however he developed symptoms of tardive dyskinesia such as biting his tongue, also involuntary movements on his neck and sometimes on his eye lids.  On his last visit we stopped Abilify and tic resolved,he took Trazodone after that we switched to seroquel but he has been sleeping well without medication. 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   Mild episode of recurrent major depressive disorder 12/29/2015   Dyslipidemia associated with type 2 diabetes mellitus 04/30/2015   Circadian rhythm sleep disorder, shift work type 04/23/2015   Dyslipidemia 04/23/2015   H/O lumbosacral spine surgery 04/23/2015   H/O umbilical hernia repair 04/23/2015   Dysmetabolic syndrome 04/23/2015   Morbid (severe) obesity due to excess calories 04/23/2015   ED (erectile dysfunction) of organic origin 04/23/2015   Obstructive apnea 04/23/2015   Vitamin D deficiency 04/23/2015   Decreased testosterone level 04/23/2015   Gastro-esophageal reflux disease without esophagitis 08/22/2009    Past Surgical History:  Procedure Laterality Date   COLONOSCOPY WITH PROPOFOL N/A 08/13/2019   Procedure: COLONOSCOPY  WITH BIOPSY;  Surgeon: Midge Minium, MD;  Location: [facility];  Service: Endoscopy;  Laterality: N/A;   microdecompression lumbar spine  02/2011   L4-5 and L5-S1 by Dr. Loistine Simas   POLYPECTOMY N/A 08/13/2019   Procedure: POLYPECTOMY;  Surgeon: Midge Minium, MD;  Location: [facility];  Service: Endoscopy;  Laterality: N/A;   UMBILICAL HERNIA REPAIR  07/10/2011    Family History  Problem Relation Age of Onset   Diabetes Mother    Hypertension Father    CAD Father    Hypertension Sister    Hypertension Daughter    ADD / ADHD Son     Social History   Tobacco Use   Smoking status: Former    Packs/day:  1.00    Years: 31.00    Additional pack years: 0.00    Total pack years: 31.00    Types: Cigarettes    Start date: 10/28/1980    Quit date: 06/28/2012    Years since quitting: 10.6   Smokeless tobacco: Never  Substance Use Topics   Alcohol use: Yes    Alcohol/week: 0.0 standard drinks of alcohol    Comment: rarely-a beer a month     Current Outpatient Medications:    atorvastatin (LIPITOR) 40 MG tablet, TAKE 1 TABLET(40 MG) BY MOUTH DAILY, Disp: 90 tablet, Rfl: 1   losartan (COZAAR) 25 MG tablet, Take 1 tablet (25 mg total) by mouth daily., Disp: 90 tablet, Rfl: 1   sildenafil (VIAGRA) 100 MG tablet, Take 1 tablet (100 mg total) by mouth daily as needed for erectile dysfunction., Disp: 30 tablet, Rfl: 1   venlafaxine XR (EFFEXOR-XR) 150 MG 24 hr capsule, Take 1 capsule (150 mg total) by mouth daily with breakfast., Disp: 90 capsule, Rfl: 1  Allergies  Allergen Reactions   Oxycodone-Acetaminophen Itching   Trulicity [Dulaglutide] Nausea Only    And diarrhea    I personally reviewed active problem list, medication list, allergies, family history, social history, health maintenance with the patient/caregiver today.   ROS  Constitutional: Negative for fever , positive for weight change - lost 34 lbs in the past 6 months .  Respiratory: Negative for cough and shortness of breath.   Cardiovascular: Negative for chest pain or palpitations.  Gastrointestinal: Negative for abdominal pain, no bowel changes.  Musculoskeletal: Negative for gait problem or joint swelling.  Skin: Negative for rash.  Neurological: Negative for dizziness or headache.  No other specific complaints in a complete review of systems (except as listed in HPI above).   Objective  Vitals:   02/11/23 0744  BP: 122/68  Pulse: 82  Resp: 16  SpO2: 98%  Weight: 237 lb (107.5 kg)  Height:  (1.778 m)    Body mass index is 34.01 kg/m.  Physical Exam  Constitutional: Patient appears well-developed and  well-nourished. Obese  No distress.  HEENT: head atraumatic, normocephalic, pupils equal and reactive to light, neck supple Cardiovascular: Normal rate, regular rhythm and normal heart sounds.  No murmur heard. No BLE edema. Pulmonary/Chest: Effort normal and breath sounds normal. No respiratory distress. Abdominal: Soft.  There is no tenderness. Psychiatric: Patient has a normal mood and affect. behavior is normal. Judgment and thought content normal.   Recent Results (from the past 2160 hour(s))  POCT HgB A1C     Status: None   Collection Time: 02/11/23  7:52 AM  Result Value Ref Range   Hemoglobin A1C 4.9 4.0 - 5.6 %   HbA1c POC (<> result, manual entry)  HbA1c, POC (prediabetic range)     HbA1c, POC (controlled diabetic range)       PHQ2/9:    02/11/2023    7:50 AM 08/02/2022    8:10 AM 01/25/2022   10:11 AM 08/31/2021   11:25 AM 07/11/2021   10:34 AM  Depression screen PHQ 2/9  Decreased Interest 0 2 1 0 0  Down, Depressed, Hopeless 0 0 0 0 0  PHQ - 2 Score 0 2 1 0 0  Altered sleeping 0 3 0 0 0  Tired, decreased energy 0 0 0 0 0  Change in appetite 0 0 0 0 0  Feeling bad or failure about yourself  0 0 0 0 0  Trouble concentrating 0 0 0 0 0  Moving slowly or fidgety/restless 0 0 0 0 0  Suicidal thoughts 0 0 0 0 0  PHQ-9 Score 0 5 1 0 0  Difficult doing work/chores    Not difficult at all     phq 9 is negative   Fall Risk:    02/11/2023    7:50 AM 08/02/2022    8:10 AM 01/25/2022   10:11 AM 08/31/2021   11:25 AM 07/11/2021   10:34 AM  Fall Risk   Falls in the past year? 0 0 0 0 0  Number falls in past yr: 0 0 0 0 0  Injury with Fall? 0 0 0 0 0  Risk for fall due to : No Fall Risks No Fall Risks No Fall Risks    Follow up Falls prevention discussed Falls prevention discussed Falls prevention discussed        Functional Status Survey: Is the patient deaf or have difficulty hearing?: No Does the patient have difficulty seeing, even when wearing  glasses/contacts?: No Does the patient have difficulty concentrating, remembering, or making decisions?: No Does the patient have difficulty walking or climbing stairs?: No Does the patient have difficulty dressing or bathing?: No Does the patient have difficulty doing errands alone such as visiting a doctor's office or shopping?: No    Assessment & Plan  1. Controlled type 2 diabetes with neuropathy  - POCT HgB A1C - Comprehensive metabolic panel - Lipid panel  2. Weight loss  - TSH - Sedimentation rate - C-reactive protein  3. History of bariatric surgery  - Comprehensive metabolic panel - CBC with Differential/Platelet - B12 and Folate Panel - VITAMIN D 25 Hydroxy (Vit-D Deficiency, Fractures)  4. Hypertension, benign  - losartan (COZAAR) 25 MG tablet; Take 1 tablet (25 mg total) by mouth daily.  Dispense: 90 tablet; Refill: 1  5. Dyslipidemia  - Atorvastatin (LIPITOR) 40 MG tablet; TAKE 1 TABLET(40 MG) BY MOUTH DAILY  Dispense: 90 tablet; Refill: 1  6. ED (erectile dysfunction) of organic origin  - sildenafil (VIAGRA) 100 MG tablet; Take 1 tablet (100 mg total) by mouth daily as needed for erectile dysfunction.  Dispense: 30 tablet; Refill: 1  7. Major depression in remission  - venlafaxine XR (EFFEXOR-XR) 150 MG 24 hr capsule; Take 1 capsule (150 mg total) by mouth daily with breakfast.  Dispense: 90 capsule; Refill: 1

## 2023-02-11 ENCOUNTER — Encounter: Payer: Self-pay | Admitting: Family Medicine

## 2023-02-11 ENCOUNTER — Ambulatory Visit: Payer: Managed Care, Other (non HMO) | Admitting: Family Medicine

## 2023-02-11 VITALS — BP 122/68 | HR 82 | Resp 16 | Ht 70.0 in | Wt 237.0 lb

## 2023-02-11 DIAGNOSIS — E1140 Type 2 diabetes mellitus with diabetic neuropathy, unspecified: Secondary | ICD-10-CM | POA: Diagnosis not present

## 2023-02-11 DIAGNOSIS — E785 Hyperlipidemia, unspecified: Secondary | ICD-10-CM

## 2023-02-11 DIAGNOSIS — R634 Abnormal weight loss: Secondary | ICD-10-CM

## 2023-02-11 DIAGNOSIS — Z9884 Bariatric surgery status: Secondary | ICD-10-CM | POA: Diagnosis not present

## 2023-02-11 DIAGNOSIS — I10 Essential (primary) hypertension: Secondary | ICD-10-CM

## 2023-02-11 DIAGNOSIS — F325 Major depressive disorder, single episode, in full remission: Secondary | ICD-10-CM

## 2023-02-11 DIAGNOSIS — N529 Male erectile dysfunction, unspecified: Secondary | ICD-10-CM

## 2023-02-11 LAB — POCT GLYCOSYLATED HEMOGLOBIN (HGB A1C): Hemoglobin A1C: 4.9 % (ref 4.0–5.6)

## 2023-02-11 MED ORDER — ATORVASTATIN CALCIUM 40 MG PO TABS: ORAL_TABLET | ORAL | 1 refills | Status: DC

## 2023-02-11 MED ORDER — LOSARTAN POTASSIUM 25 MG PO TABS: 25.0000 mg | ORAL_TABLET | Freq: Every day | ORAL | 1 refills | Status: DC

## 2023-02-11 MED ORDER — SILDENAFIL CITRATE 100 MG PO TABS
100.0000 mg | ORAL_TABLET | Freq: Every day | ORAL | 1 refills | Status: DC | PRN
Start: 2023-02-11 — End: 2024-02-05

## 2023-02-11 MED ORDER — VENLAFAXINE HCL ER 150 MG PO CP24
150.0000 mg | ORAL_CAPSULE | Freq: Every day | ORAL | 1 refills | Status: DC
Start: 2023-02-11 — End: 2023-08-08

## 2023-02-12 LAB — CBC WITH DIFFERENTIAL/PLATELET
Basophils Absolute: 0.1 10*3/uL (ref 0.0–0.2)
Basos: 1 %
EOS (ABSOLUTE): 0.1 10*3/uL (ref 0.0–0.4)
Eos: 2 %
Hematocrit: 40.6 % (ref 37.5–51.0)
Hemoglobin: 13.6 g/dL (ref 13.0–17.7)
Immature Grans (Abs): 0 10*3/uL (ref 0.0–0.1)
Immature Granulocytes: 0 %
Lymphocytes Absolute: 1.7 10*3/uL (ref 0.7–3.1)
Lymphs: 29 %
MCH: 28.1 pg (ref 26.6–33.0)
MCHC: 33.5 g/dL (ref 31.5–35.7)
MCV: 84 fL (ref 79–97)
Monocytes Absolute: 0.5 10*3/uL (ref 0.1–0.9)
Monocytes: 9 %
Neutrophils Absolute: 3.5 10*3/uL (ref 1.4–7.0)
Neutrophils: 59 %
Platelets: 289 10*3/uL (ref 150–450)
RBC: 4.84 x10E6/uL (ref 4.14–5.80)
RDW: 13.4 % (ref 11.6–15.4)
WBC: 5.8 10*3/uL (ref 3.4–10.8)

## 2023-02-12 LAB — COMPREHENSIVE METABOLIC PANEL
ALT: 18 IU/L (ref 0–44)
AST: 20 IU/L (ref 0–40)
Albumin/Globulin Ratio: 1.5 (ref 1.2–2.2)
Albumin: 4.2 g/dL (ref 3.8–4.9)
Alkaline Phosphatase: 86 IU/L (ref 44–121)
BUN/Creatinine Ratio: 12 (ref 9–20)
BUN: 12 mg/dL (ref 6–24)
Bilirubin Total: 0.4 mg/dL (ref 0.0–1.2)
CO2: 27 mmol/L (ref 20–29)
Calcium: 9.1 mg/dL (ref 8.7–10.2)
Chloride: 100 mmol/L (ref 96–106)
Creatinine, Ser: 0.98 mg/dL (ref 0.76–1.27)
Globulin, Total: 2.8 g/dL (ref 1.5–4.5)
Glucose: 80 mg/dL (ref 70–99)
Potassium: 4.4 mmol/L (ref 3.5–5.2)
Sodium: 138 mmol/L (ref 134–144)
Total Protein: 7 g/dL (ref 6.0–8.5)
eGFR: 92 mL/min/{1.73_m2} (ref >59)

## 2023-02-12 LAB — LIPID PANEL
Chol/HDL Ratio: 2.5 ratio (ref 0.0–5.0)
Cholesterol, Total: 109 mg/dL (ref 100–199)
HDL: 43 mg/dL (ref >39)
LDL Chol Calc (NIH): 52 mg/dL (ref 0–99)
Triglycerides: 63 mg/dL (ref 0–149)
VLDL Cholesterol Cal: 14 mg/dL (ref 5–40)

## 2023-02-12 LAB — B12 AND FOLATE PANEL
Folate: 4 ng/mL (ref >3.0)
Vitamin B-12: 870 pg/mL (ref 232–1245)

## 2023-02-12 LAB — C-REACTIVE PROTEIN: CRP: <1 mg/L (ref 0–10)

## 2023-02-12 LAB — SEDIMENTATION RATE: Sed Rate: 8 mm/hr (ref 0–30)

## 2023-02-12 LAB — TSH: TSH: 1.96 u[IU]/mL (ref 0.450–4.500)

## 2023-02-12 LAB — VITAMIN D 25 HYDROXY (VIT D DEFICIENCY, FRACTURES): Vit D, 25-Hydroxy: 33.1 ng/mL (ref 30.0–100.0)

## 2023-03-20 NOTE — Progress Notes (Signed)
Name: Jimmy Bryant   MRN: [id number]    DOB: March 28, 1971   Date:03/21/2023       Progress Note  Subjective  Chief Complaint  Annual Exam  HPI  Patient presents for annual CPE.  IPSS Questionnaire (AUA-7): Over the past month.   1)  How often have you had a sensation of not emptying your bladder completely after you finish urinating?  0 - Not at all  2)  How often have you had to urinate again less than two hours after you finished urinating? 0 - Not at all  3)  How often have you found you stopped and started again several times when you urinated?  0 - Not at all  4) How difficult have you found it to postpone urination?  0 - Not at all  5) How often have you had a weak urinary stream?  0 - Not at all  6) How often have you had to push or strain to begin urination?  0 - Not at all  7) How many times did you most typically get up to urinate from the time you went to bed until the time you got up in the morning?  0 - None  Total score:  0-7 mildly symptomatic   8-19 moderately symptomatic   20-35 severely symptomatic     Diet: no longer snacking inside his truck, eats his meals at home and takes left overs for lunch  Exercise: discussed regular physical activity  Last Dental Exam: he is due for an exam  Last Eye Exam: up to date   Depression: phq 9 is negative    03/21/2023    2:21 PM 02/11/2023    7:50 AM 08/02/2022    8:10 AM 01/25/2022   10:11 AM 08/31/2021   11:25 AM  Depression screen PHQ 2/9  Decreased Interest 1 0 2 1 0  Down, Depressed, Hopeless 0 0 0 0 0  PHQ - 2 Score 1 0 2 1 0  Altered sleeping 0 0 3 0 0  Tired, decreased energy 1 0 0 0 0  Change in appetite 0 0 0 0 0  Feeling bad or failure about yourself  0 0 0 0 0  Trouble concentrating 0 0 0 0 0  Moving slowly or fidgety/restless 0 0 0 0 0  Suicidal thoughts 0 0 0 0 0  PHQ-9 Score 2 0 5 1 0  Difficult doing work/chores     Not difficult at all    Hypertension:  BP Readings from Last 3 Encounters:   03/21/23 120/72  02/11/23 122/68  08/02/22 132/78    Obesity: Wt Readings from Last 3 Encounters:  03/21/23 236 lb 4.8 oz (107.2 kg)  02/11/23 237 lb (107.5 kg)  08/02/22 271 lb (122.9 kg)   BMI Readings from Last 3 Encounters:  03/21/23 33.91 kg/m  02/11/23 34.01 kg/m  08/02/22 37.80 kg/m     Lipids:  Lab Results  Component Value Date   CHOL 109 02/11/2023   CHOL 122 01/25/2022   CHOL 115 06/18/2021   Lab Results  Component Value Date   HDL 43 02/11/2023   HDL 36 (L) 01/25/2022   HDL 37 (L) 06/18/2021   Lab Results  Component Value Date   LDLCALC 52 02/11/2023   LDLCALC 65 01/25/2022   LDLCALC 60 06/18/2021   Lab Results  Component Value Date   TRIG 63 02/11/2023   TRIG 115 01/25/2022   TRIG 94 06/18/2021   Lab Results  Component Value Date   CHOLHDL 2.5 02/11/2023   CHOLHDL 3.4 01/25/2022   CHOLHDL 3.1 06/18/2021   No results found for: "LDLDIRECT" Glucose:  Glucose  Date Value Ref Range Status  02/11/2023 80 70 - 99 mg/dL Final  81/19/1478 42 (L) 70 - 99 mg/dL Final  29/56/2130 95 65 - 99 mg/dL Final   Glucose, Bld  Date Value Ref Range Status  05/31/2016 107 (H) 65 - 99 mg/dL Final    Flowsheet Row Office Visit from 03/21/2023 in [facility]  AUDIT-C Score 1       Married STD testing and prevention (HIV/chl/gon/syphilis): 12/17/18 Sexual history: no libido, he needs to take medication for ED. We will refer him to urologist  Hep C Screening: 06/18/21 Skin cancer: Discussed monitoring for atypical lesions Colorectal cancer: 08/13/19 Prostate cancer:  we will check since he is trying to get testosterone replacement    Lung cancer:  Low Dose CT Chest recommended if Age 5-80 years, 30 pack-year currently smoking OR have quit w/in 15years. Patient  not a candidate for screening   EKG: up to date   Vaccines:   Tdap: up to date Shingrix: up to date Pneumonia: up to date Flu: up to date COVID-19: up to  date  Advanced Care Planning: A voluntary discussion about advance care planning including the explanation and discussion of advance directives.  Discussed health care proxy and Living will, and the patient was able to identify a health care proxy as wife .  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/23/2019   Mild episode of recurrent major depressive disorder (HCC) 12/29/2015   Dyslipidemia associated with type 2 diabetes mellitus (HCC) 04/30/2015   Circadian rhythm sleep disorder, shift work type 04/23/2015   Dyslipidemia 04/23/2015   H/O lumbosacral spine surgery 04/23/2015   H/O umbilical hernia repair 04/23/2015   Dysmetabolic syndrome 04/23/2015   ED (erectile dysfunction) of organic origin 04/23/2015   Obstructive apnea 04/23/2015   Vitamin D deficiency 04/23/2015   Decreased testosterone level 04/23/2015   Gastro-esophageal reflux disease without esophagitis 08/22/2009    Past Surgical History:  Procedure Laterality Date   COLONOSCOPY WITH PROPOFOL N/A 08/13/2019   Procedure: COLONOSCOPY WITH BIOPSY;  Surgeon: Midge Minium, MD;  Location: [facility];  Service: Endoscopy;  Laterality: N/A;   microdecompression lumbar spine  02/2011   L4-5 and L5-S1 by Dr. Loistine Simas   POLYPECTOMY N/A 08/13/2019   Procedure: POLYPECTOMY;  Surgeon: Midge Minium, MD;  Location: [facility];  Service: Endoscopy;  Laterality: N/A;   UMBILICAL HERNIA REPAIR  07/10/2011    Family History  Problem Relation Age of Onset   Diabetes Mother    Hypertension Father    CAD Father    Hypertension Sister    Hypertension Daughter    ADD / ADHD Son     Social History   Socioeconomic History   Marital status: Married    Spouse name: Tresa Endo   Number of children: 1   Years of education: Not on file   Highest education  level: 8th grade  Occupational History   Not on file  Tobacco Use   Smoking status: Former    Packs/day: 1.00    Years: 31.00    Additional pack years: 0.00  Total pack years: 31.00    Types: Cigarettes    Start date: 10/28/1980    Quit date: 06/28/2012    Years since quitting: 10.7   Smokeless tobacco: Never  Vaping Use   Vaping Use: Never used  Substance and Sexual Activity   Alcohol use: Yes    Alcohol/week: 0.0 standard drinks of alcohol    Comment: rarely-a beer a month   Drug use: No   Sexual activity: Yes    Partners: Female  Other Topics Concern   Not on file  Social History Narrative   Not on file   Social Determinants of Health   Financial Resource Strain: Medium Risk (03/21/2023)   Overall Financial Resource Strain (CARDIA)    Difficulty of Paying Living Expenses: Somewhat hard  Food Insecurity: No Food Insecurity (03/21/2023)   Hunger Vital Sign    Worried About Running Out of Food in the Last Year: Never true    Ran Out of Food in the Last Year: Never true  Transportation Needs: No Transportation Needs (03/21/2023)   PRAPARE - Administrator, Civil Service (Medical): No    Lack of Transportation (Non-Medical): No  Physical Activity: Inactive (03/21/2023)   Exercise Vital Sign    Days of Exercise per Week: 0 days    Minutes of Exercise per Session: 0 min  Stress: No Stress Concern Present (03/21/2023)   [employer] of Occupational Health - Occupational Stress Questionnaire    Feeling of Stress : Only a little  Social Connections: Moderately Isolated (03/21/2023)   Social Connection and Isolation Panel [NHANES]    Frequency of Communication with Friends and Family: Once a week    Frequency of Social Gatherings with Friends and Family: Never    Attends Religious Services: 1 to 4 times per year    Active Member of [employer] or Organizations: No    Attends Banker Meetings: Never    Marital Status: Married  Catering manager Violence:  Not At Risk (03/21/2023)   Humiliation, Afraid, Rape, and Kick questionnaire    Fear of Current or Ex-Partner: No    Emotionally Abused: No    Physically Abused: No    Sexually Abused: No     Current Outpatient Medications:    atorvastatin (LIPITOR) 40 MG tablet, TAKE 1 TABLET(40 MG) BY MOUTH DAILY, Disp: 90 tablet, Rfl: 1   losartan (COZAAR) 25 MG tablet, Take 1 tablet (25 mg total) by mouth daily., Disp: 90 tablet, Rfl: 1   sildenafil (VIAGRA) 100 MG tablet, Take 1 tablet (100 mg total) by mouth daily as needed for erectile dysfunction., Disp: 30 tablet, Rfl: 1   venlafaxine XR (EFFEXOR-XR) 150 MG 24 hr capsule, Take 1 capsule (150 mg total) by mouth daily with breakfast., Disp: 90 capsule, Rfl: 1  Allergies  Allergen Reactions   Oxycodone-Acetaminophen Itching   Trulicity [Dulaglutide] Nausea Only    And diarrhea     ROS  Constitutional: Negative for fever or weight change.  Respiratory: Negative for cough and shortness of breath.   Cardiovascular: Negative for chest pain or palpitations.  Gastrointestinal: Negative for abdominal pain, no bowel changes.  Musculoskeletal: Negative for gait problem or joint swelling.  Skin: Negative for rash.  Neurological: Negative for dizziness or headache.  No other specific complaints in a complete review of systems (except as listed in HPI above).    Objective  Vitals:   03/21/23 1419  BP: 120/72  Pulse: 77  Resp: 16  Temp: 97.8 F (  36.6 C)  TempSrc: Oral  SpO2: 98%  Weight: 236 lb 4.8 oz (107.2 kg)  Height: 5\' 10"  (1.778 m)    Body mass index is 33.91 kg/m.  Physical Exam  Constitutional: Patient appears well-developed ,No distress.  HENT: Head: Normocephalic and atraumatic. Ears: B TMs ok, no erythema or effusion; Nose: Nose normal. Mouth/Throat: Oropharynx is clear and moist. No oropharyngeal exudate.  Eyes: Conjunctivae and EOM are normal. Pupils are equal, round, and reactive to light. No scleral icterus.  Neck:  Normal range of motion. Neck supple. No JVD present. No thyromegaly present.  Cardiovascular: Normal rate, regular rhythm and normal heart sounds.  No murmur heard. No BLE edema. Pulmonary/Chest: Effort normal and breath sounds normal. No respiratory distress. Abdominal: Soft. Bowel sounds are normal, no distension. There is no tenderness. no masses MALE GENITALIA: Normal descended testes bilaterally, no masses palpated, no hernias, no lesions, no discharge RECTAL:not done Musculoskeletal: Normal range of motion, no joint effusions. No gross deformities Neurological: he is alert and oriented to person, place, and time. No cranial nerve deficit. Coordination, balance, strength, speech and gait are normal.  Skin: Skin is warm and dry. No rash noted. No erythema.  Psychiatric: Patient has a normal mood and affect. behavior is normal. Judgment and thought content normal.   Recent Results (from the past 2160 hour(s))  POCT HgB A1C     Status: None   Collection Time: 02/11/23  7:52 AM  Result Value Ref Range   Hemoglobin A1C 4.9 4.0 - 5.6 %   HbA1c POC (<> result, manual entry)     HbA1c, POC (prediabetic range)     HbA1c, POC (controlled diabetic range)    Comprehensive metabolic panel     Status: None   Collection Time: 02/11/23  8:47 AM  Result Value Ref Range   Glucose 80 70 - 99 mg/dL   BUN 12 6 - 24 mg/dL   Creatinine, Ser 8.11 0.76 - 1.27 mg/dL   eGFR 92 >91 YN/WGN/5.62   BUN/Creatinine Ratio 12 9 - 20   Sodium 138 134 - 144 mmol/L   Potassium 4.4 3.5 - 5.2 mmol/L   Chloride 100 96 - 106 mmol/L   CO2 27 20 - 29 mmol/L   Calcium 9.1 8.7 - 10.2 mg/dL   Total Protein 7.0 6.0 - 8.5 g/dL   Albumin 4.2 3.8 - 4.9 g/dL   Globulin, Total 2.8 1.5 - 4.5 g/dL   Albumin/Globulin Ratio 1.5 1.2 - 2.2   Bilirubin Total 0.4 0.0 - 1.2 mg/dL   Alkaline Phosphatase 86 44 - 121 IU/L   AST 20 0 - 40 IU/L   ALT 18 0 - 44 IU/L  CBC with Differential/Platelet     Status: None   Collection Time:  02/11/23  8:47 AM  Result Value Ref Range   WBC 5.8 3.4 - 10.8 x10E3/uL   RBC 4.84 4.14 - 5.80 x10E6/uL   Hemoglobin 13.6 13.0 - 17.7 g/dL   Hematocrit 13.0 86.5 - 51.0 %   MCV 84 79 - 97 fL   MCH 28.1 26.6 - 33.0 pg   MCHC 33.5 31.5 - 35.7 g/dL   RDW 78.4 69.6 - 29.5 %   Platelets 289 150 - 450 x10E3/uL   Neutrophils 59 Not Estab. %   Lymphs 29 Not Estab. %   Monocytes 9 Not Estab. %   Eos 2 Not Estab. %   Basos 1 Not Estab. %   Neutrophils Absolute 3.5 1.4 - 7.0 x10E3/uL  Lymphocytes Absolute 1.7 0.7 - 3.1 x10E3/uL   Monocytes Absolute 0.5 0.1 - 0.9 x10E3/uL   EOS (ABSOLUTE) 0.1 0.0 - 0.4 x10E3/uL   Basophils Absolute 0.1 0.0 - 0.2 x10E3/uL   Immature Granulocytes 0 Not Estab. %   Immature Grans (Abs) 0.0 0.0 - 0.1 x10E3/uL  TSH     Status: None   Collection Time: 02/11/23  8:47 AM  Result Value Ref Range   TSH 1.960 0.450 - 4.500 uIU/mL  B12 and Folate Panel     Status: None   Collection Time: 02/11/23  8:47 AM  Result Value Ref Range   Vitamin B-12 870 232 - 1,245 pg/mL   Folate 4.0 >3.0 ng/mL    Comment: A serum folate concentration of less than 3.1 ng/mL is considered to represent clinical deficiency.   VITAMIN D 25 Hydroxy (Vit-D Deficiency, Fractures)     Status: None   Collection Time: 02/11/23  8:47 AM  Result Value Ref Range   Vit D, 25-Hydroxy 33.1 30.0 - 100.0 ng/mL    Comment: Vitamin D deficiency has been defined by the Institute of Medicine and an Endocrine Society practice guideline as a level of serum 25-OH vitamin D less than 20 ng/mL (1,2). The Endocrine Society went on to further define vitamin D insufficiency as a level between 21 and 29 ng/mL (2). 1. IOM (Institute of Medicine). 2010. Dietary reference    intakes for calcium and D. [place]: The    [employer]. 2. Holick MF, Binkley Grimes, Bischoff-Ferrari HA, et al.    Evaluation, treatment, and prevention of vitamin D    deficiency: an Endocrine Society clinical practice     guideline. JCEM. 2011 Jul; 96(7):1911-30.   Sedimentation rate     Status: None   Collection Time: 02/11/23  8:47 AM  Result Value Ref Range   Sed Rate 8 0 - 30 mm/hr  C-reactive protein     Status: None   Collection Time: 02/11/23  8:47 AM  Result Value Ref Range   CRP <1 0 - 10 mg/L  Lipid panel     Status: None   Collection Time: 02/11/23  8:47 AM  Result Value Ref Range   Cholesterol, Total 109 100 - 199 mg/dL   Triglycerides 63 0 - 149 mg/dL   HDL 43 >16 mg/dL   VLDL Cholesterol Cal 14 5 - 40 mg/dL   LDL Chol Calc (NIH) 52 0 - 99 mg/dL   Chol/HDL Ratio 2.5 0.0 - 5.0 ratio    Comment:                                   T. Chol/HDL Ratio                                             Men  Women                               1/2 Avg.Risk  3.4    3.3                                   Avg.Risk  5.0    4.4  2X Avg.Risk  9.6    7.1                                3X Avg.Risk 23.4   11.0      Fall Risk:    03/21/2023    2:20 PM 02/11/2023    7:50 AM 08/02/2022    8:10 AM 01/25/2022   10:11 AM 08/31/2021   11:25 AM  Fall Risk   Falls in the past year? 0 0 0 0 0  Number falls in past yr:  0 0 0 0  Injury with Fall?  0 0 0 0  Risk for fall due to : No Fall Risks No Fall Risks No Fall Risks No Fall Risks   Follow up Falls evaluation completed;Education provided;Falls prevention discussed Falls prevention discussed Falls prevention discussed Falls prevention discussed      Functional Status Survey: Is the patient deaf or have difficulty hearing?: No Does the patient have difficulty seeing, even when wearing glasses/contacts?: No Does the patient have difficulty concentrating, remembering, or making decisions?: No Does the patient have difficulty walking or climbing stairs?: No Does the patient have difficulty dressing or bathing?: No Does the patient have difficulty doing errands alone such as visiting a doctor's office or shopping?:  No    Assessment & Plan  1. Well adult exam   2. Hypogonadism in male  - Ambulatory referral to Urology - Testosterone,Free and Total  3. ED (erectile dysfunction) of organic origin   4. Prostate cancer screening  - PSA     -Prostate cancer screening and PSA options (with potential risks and benefits of testing vs not testing) were discussed along with recent recs/guidelines. -USPSTF grade A and B recommendations reviewed with patient; age-appropriate recommendations, preventive care, screening tests, etc discussed and encouraged; healthy living encouraged; see AVS for patient education given to patient -Discussed importance of 150 minutes of physical activity weekly, eat two servings of fish weekly, eat one serving of tree nuts ( cashews, pistachios, pecans, almonds.Marland Kitchen) every other day, eat 6 servings of fruit/vegetables daily and drink plenty of water and avoid sweet beverages.  -Reviewed Health Maintenance: yes

## 2023-03-20 NOTE — Patient Instructions (Signed)

## 2023-03-21 ENCOUNTER — Encounter: Payer: Self-pay | Admitting: Family Medicine

## 2023-03-21 ENCOUNTER — Ambulatory Visit (INDEPENDENT_AMBULATORY_CARE_PROVIDER_SITE_OTHER): Payer: Managed Care, Other (non HMO) | Admitting: Family Medicine

## 2023-03-21 VITALS — BP 120/72 | HR 77 | Temp 97.8°F | Resp 16 | Ht 70.0 in | Wt 236.3 lb

## 2023-03-21 DIAGNOSIS — Z125 Encounter for screening for malignant neoplasm of prostate: Secondary | ICD-10-CM

## 2023-03-21 DIAGNOSIS — N529 Male erectile dysfunction, unspecified: Secondary | ICD-10-CM

## 2023-03-21 DIAGNOSIS — Z0000 Encounter for general adult medical examination without abnormal findings: Secondary | ICD-10-CM | POA: Diagnosis not present

## 2023-03-21 DIAGNOSIS — E291 Testicular hypofunction: Secondary | ICD-10-CM | POA: Diagnosis not present

## 2023-04-03 LAB — PSA: Prostate Specific Ag, Serum: 1.7 ng/mL (ref 0.0–4.0)

## 2023-04-05 LAB — TESTOSTERONE,FREE AND TOTAL
Testosterone, Free: 6.4 pg/mL — ABNORMAL LOW (ref 7.2–24.0)
Testosterone: 478 ng/dL (ref 264–916)

## 2023-06-27 LAB — BASIC METABOLIC PANEL: Glucose: 77

## 2023-06-27 LAB — LIPID PANEL
Cholesterol: 114 (ref 0–200)
HDL: 45 (ref 35–70)
LDL Cholesterol: 54
Triglycerides: 74 (ref 40–160)

## 2023-06-27 LAB — HEMOGLOBIN A1C: Hemoglobin A1C: 5.1

## 2023-07-04 ENCOUNTER — Encounter: Payer: Self-pay | Admitting: Family Medicine

## 2023-08-07 NOTE — Progress Notes (Signed)
Name: Jimmy Bryant   MRN: [id number]    DOB: 12/09/1970   Date:08/08/2023       Progress Note  Subjective  Chief Complaint  Follow Up  HPI  Morbid obesity: he had gastric bypass surgery on 11/09/2018 done at [facility] by Dr. Artist Pais. He tolerated procedure well, at the day of surgery his weight was 313 lbs he was stable 230's -234 lbs for months however gradually gaining weight back, his weight spiked to 281 lbs Spring of 2023 , he changed his diet and was down to 271 lbs Fall of 2023 , today his weight is down to 231 lbs . He states no longer staying over night on his job site, diet has improved, his son has celiac disease and they eat a lot of chicken and vegetables.  He eats smaller portion. Denies abdominal pain, change in bowel movements   Intertrigo: he has noticed increase chaffing on abdominal fold due to weight loss, it is usually read and irritated. Discussed otc medication and may need to have plastic surgery. He would like to hold off on referral at this time    DM with ED: he has been off medication since bariatric surgery on 11/09/18, weight is down 5 lbs since last visit. . Weight today is down to 231 lbs  He denies polyphagia, polydipsia or polyuria. ED is controlled with Viagra. Diabetes associated with obesity and HTN. A1C has been well controlled    HTN: BP today is elevated today,, he states he is always stressed due to financial difficulties. He denies chest pain, dizziness  or palpitation . We will adjust bp medication and advised him to return in one week for cma visit and bp check    ED: he has low libido, takes Viagra and able to sustain an erection, sometimes does not reach an orgasm.   GERD: he is doing well, no longer having problems. Not currently taking medication.   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 Pressure is 9 cm H2O . Unchanged   Major Depression: he was on Effexor 150 mg and  Abilify 5 mg and was doing well emotionally however he developed symptoms of tardive dyskinesia such as biting his tongue, also involuntary movements on his neck and sometimes on his eye lids.  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   Mild episode of recurrent major depressive disorder (HCC) 12/29/2015   Dyslipidemia associated with type 2 diabetes mellitus (HCC) 04/30/2015   Circadian rhythm sleep disorder, shift work type 04/23/2015   Dyslipidemia 04/23/2015   H/O lumbosacral spine surgery 04/23/2015   H/O umbilical hernia repair 04/23/2015   Dysmetabolic syndrome 04/23/2015   ED (erectile dysfunction) of organic origin 04/23/2015   Obstructive apnea 04/23/2015   Vitamin D deficiency 04/23/2015   Decreased testosterone level 04/23/2015   Gastro-esophageal reflux disease without esophagitis 08/22/2009  Past Surgical History:  Procedure Laterality Date   COLONOSCOPY WITH PROPOFOL N/A 08/13/2019   Procedure: COLONOSCOPY WITH BIOPSY;  Surgeon: Midge Minium, MD;  Location: [facility];  Service: Endoscopy;  Laterality: N/A;   microdecompression lumbar spine  02/2011   L4-5 and L5-S1 by Dr. Loistine Simas   POLYPECTOMY N/A 08/13/2019   Procedure: POLYPECTOMY;  Surgeon: Midge Minium, MD;  Location: [facility];  Service: Endoscopy;  Laterality: N/A;   UMBILICAL HERNIA REPAIR  07/10/2011    Family History  Problem Relation Age of Onset   Diabetes Mother    Hypertension Father    CAD Father    Hypertension  Sister    Hypertension Daughter    ADD / ADHD Son     Social History   Tobacco Use   Smoking status: Former    Current packs/day: 0.00    Average packs/day: 1 pack/day for 31.7 years (31.7 ttl pk-yrs)    Types: Cigarettes    Start date: 10/28/1980    Quit date: 06/28/2012    Years since quitting: 11.1   Smokeless tobacco: Never  Substance Use Topics   Alcohol use: Yes    Alcohol/week: 0.0 standard drinks of alcohol    Comment: rarely-a beer a month     Current Outpatient Medications:    Multiple Vitamin (MULTIVITAMIN) tablet, Take 1 tablet by mouth daily., Disp: , Rfl:    sildenafil (VIAGRA) 100 MG tablet, Take 1 tablet (100 mg total) by mouth daily as needed for erectile dysfunction., Disp: 30 tablet, Rfl: 1   atorvastatin (LIPITOR) 40 MG tablet, TAKE 1 TABLET(40 MG) BY MOUTH DAILY, Disp: 90 tablet, Rfl: 1   losartan (COZAAR) 50 MG tablet, Take 1 tablet (50 mg total) by mouth daily., Disp: 90 tablet, Rfl: 1   venlafaxine XR (EFFEXOR-XR) 150 MG 24 hr capsule, Take 1 capsule (150 mg total) by mouth daily with breakfast., Disp: 90 capsule, Rfl: 1  Allergies  Allergen Reactions   Oxycodone-Acetaminophen Itching   Trulicity [Dulaglutide] Nausea Only    And diarrhea    I personally reviewed active problem list, medication list, allergies, family history, social history, health maintenance with the patient/caregiver today.   ROS  Ten systems reviewed and is negative except as mentioned in HPI    Objective  Vitals:   08/08/23 1422 08/08/23 1433  BP: (!) 150/70 (!) 142/68  Pulse: 92   Resp: 16   SpO2: 97%   Weight: 231 lb (104.8 kg)   Height: 5\' 10"  (1.778 m)     Body mass index is 33.15 kg/m.  Physical Exam  Constitutional: Patient appears well-developed and well-nourished. Obese  No distress.  HEENT: head atraumatic, normocephalic, pupils equal and reactive to light, neck supple Cardiovascular: Normal rate, regular rhythm and normal heart sounds.  No murmur heard.  No BLE edema. Pulmonary/Chest: Effort normal and breath sounds normal. No respiratory distress. Abdominal: Soft.  There is no tenderness. Psychiatric: Patient has a normal mood and affect. behavior is normal. Judgment and thought content normal.   Recent Results (from the past 2160 hour(s))  Basic metabolic panel     Status: None   Collection Time: 06/27/23 12:00 AM  Result Value Ref Range   Glucose 77   Lipid panel     Status: None   Collection Time: 06/27/23 12:00 AM  Result Value Ref Range   Triglycerides 74 40 - 160   Cholesterol 114 0 - 200   HDL 45 35 - 70   LDL Cholesterol  54   Hemoglobin A1c     Status: None   Collection Time: 06/27/23 12:00 AM  Result Value Ref Range   Hemoglobin A1C 5.1     Diabetic Foot Exam: Diabetic Foot Exam - Simple   Simple Foot Form Visual Inspection No deformities, no ulcerations, no other skin breakdown bilaterally: Yes Sensation Testing Intact to touch and monofilament testing bilaterally: Yes Pulse Check Posterior Tibialis and Dorsalis pulse intact bilaterally: Yes Comments      PHQ2/9:    08/08/2023    2:21 PM 03/21/2023    2:21 PM 02/11/2023    7:50 AM 08/02/2022    8:10 AM 01/25/2022   10:11 AM  Depression screen PHQ 2/9  Decreased Interest 0 1 0 2 1  Down, Depressed, Hopeless 0 0 0 0 0  PHQ - 2 Score 0 1 0 2 1  Altered sleeping 0 0 0 3 0  Tired, decreased energy 0 1 0 0 0  Change in appetite 0 0 0 0 0  Feeling bad or failure about yourself  0 0 0 0 0  Trouble concentrating 0 0 0 0 0  Moving slowly or fidgety/restless 0 0 0 0 0  Suicidal thoughts 0 0 0 0 0  PHQ-9 Score 0 2 0 5 1    phq 9 is negative   Fall Risk:    08/08/2023    2:21 PM 03/21/2023    2:20 PM 02/11/2023    7:50 AM 08/02/2022    8:10 AM 01/25/2022   10:11 AM  Fall Risk   Falls in the past year? 0 0 0 0 0  Number falls in past yr: 0  0 0 0  Injury with Fall? 0  0 0 0  Risk for fall due to : No Fall Risks No Fall Risks No Fall Risks No Fall Risks No  Fall Risks  Follow up Falls prevention discussed Falls evaluation completed;Education provided;Falls prevention discussed Falls prevention discussed Falls prevention discussed Falls prevention discussed      Functional Status Survey: Is the patient deaf or have difficulty hearing?: No Does the patient have difficulty seeing, even when wearing glasses/contacts?: No Does the patient have difficulty concentrating, remembering, or making decisions?: No Does the patient have difficulty walking or climbing stairs?: No Does the patient have difficulty dressing or bathing?: No Does the patient have difficulty doing errands alone such as visiting a doctor's office or shopping?: No    Assessment & Plan  1. Controlled type 2 diabetes with neuropathy (HCC)  - Urine Microalbumin w/creat. ratio - HM Diabetes Foot Exam  2. Major depression in remission (HCC)  - venlafaxine XR (EFFEXOR-XR) 150 MG 24 hr capsule; Take 1 capsule (150 mg total) by mouth daily with breakfast.  Dispense: 90 capsule; Refill: 1  3. Need for immunization against influenza  - Flu vaccine trivalent PF, 6mos and older(Flulaval,Afluria,Fluarix,Fluzone)  4. Dyslipidemia  - atorvastatin (LIPITOR) 40 MG tablet; TAKE 1 TABLET(40 MG) BY MOUTH DAILY  Dispense: 90 tablet; Refill: 1  5. Hypertension, benign  - losartan (COZAAR) 50 MG tablet; Take 1 tablet (50 mg total) by mouth daily.  Dispense: 90 tablet; Refill: 1  6. History of bariatric surgery  Doing well   7. Gastroesophageal reflux disease without esophagitis  Controlled   8. OSA on CPAP  Compliant   9. Intertrigo  Discussed tinea pedis spray to use under abdominal flap  10. Pendulous abdomen   Discussed referral to plastic surgeon

## 2023-08-08 ENCOUNTER — Encounter: Payer: Self-pay | Admitting: Family Medicine

## 2023-08-08 ENCOUNTER — Ambulatory Visit: Payer: Managed Care, Other (non HMO) | Admitting: Family Medicine

## 2023-08-08 VITALS — BP 138/70 | HR 92 | Resp 16 | Ht 70.0 in | Wt 231.0 lb

## 2023-08-08 DIAGNOSIS — I10 Essential (primary) hypertension: Secondary | ICD-10-CM

## 2023-08-08 DIAGNOSIS — E65 Localized adiposity: Secondary | ICD-10-CM

## 2023-08-08 DIAGNOSIS — E1140 Type 2 diabetes mellitus with diabetic neuropathy, unspecified: Secondary | ICD-10-CM

## 2023-08-08 DIAGNOSIS — K219 Gastro-esophageal reflux disease without esophagitis: Secondary | ICD-10-CM

## 2023-08-08 DIAGNOSIS — L304 Erythema intertrigo: Secondary | ICD-10-CM

## 2023-08-08 DIAGNOSIS — Z23 Encounter for immunization: Secondary | ICD-10-CM

## 2023-08-08 DIAGNOSIS — F325 Major depressive disorder, single episode, in full remission: Secondary | ICD-10-CM | POA: Diagnosis not present

## 2023-08-08 DIAGNOSIS — G4733 Obstructive sleep apnea (adult) (pediatric): Secondary | ICD-10-CM

## 2023-08-08 DIAGNOSIS — E785 Hyperlipidemia, unspecified: Secondary | ICD-10-CM

## 2023-08-08 DIAGNOSIS — Z9884 Bariatric surgery status: Secondary | ICD-10-CM

## 2023-08-08 MED ORDER — LOSARTAN POTASSIUM 50 MG PO TABS
50.0000 mg | ORAL_TABLET | Freq: Every day | ORAL | 1 refills | Status: DC
Start: 2023-08-08 — End: 2024-02-05

## 2023-08-08 MED ORDER — ATORVASTATIN CALCIUM 40 MG PO TABS
ORAL_TABLET | ORAL | 1 refills | Status: DC
Start: 2023-08-08 — End: 2024-02-05

## 2023-08-08 MED ORDER — VENLAFAXINE HCL ER 150 MG PO CP24
150.0000 mg | ORAL_CAPSULE | Freq: Every day | ORAL | 1 refills | Status: DC
Start: 2023-08-08 — End: 2024-02-05

## 2024-02-05 ENCOUNTER — Ambulatory Visit: Admitting: Family Medicine

## 2024-02-05 ENCOUNTER — Encounter: Payer: Self-pay | Admitting: Family Medicine

## 2024-02-05 VITALS — BP 136/74 | HR 87 | Resp 16 | Ht 70.0 in | Wt 233.5 lb

## 2024-02-05 DIAGNOSIS — Z8639 Personal history of other endocrine, nutritional and metabolic disease: Secondary | ICD-10-CM

## 2024-02-05 DIAGNOSIS — E785 Hyperlipidemia, unspecified: Secondary | ICD-10-CM

## 2024-02-05 DIAGNOSIS — I10 Essential (primary) hypertension: Secondary | ICD-10-CM

## 2024-02-05 DIAGNOSIS — F325 Major depressive disorder, single episode, in full remission: Secondary | ICD-10-CM

## 2024-02-05 DIAGNOSIS — Z23 Encounter for immunization: Secondary | ICD-10-CM | POA: Diagnosis not present

## 2024-02-05 DIAGNOSIS — Z9884 Bariatric surgery status: Secondary | ICD-10-CM

## 2024-02-05 DIAGNOSIS — G4733 Obstructive sleep apnea (adult) (pediatric): Secondary | ICD-10-CM | POA: Diagnosis not present

## 2024-02-05 DIAGNOSIS — E291 Testicular hypofunction: Secondary | ICD-10-CM | POA: Insufficient documentation

## 2024-02-05 DIAGNOSIS — N529 Male erectile dysfunction, unspecified: Secondary | ICD-10-CM

## 2024-02-05 LAB — POCT GLYCOSYLATED HEMOGLOBIN (HGB A1C): Hemoglobin A1C: 4.9 % (ref 4.0–5.6)

## 2024-02-05 MED ORDER — LOSARTAN POTASSIUM 100 MG PO TABS: 100.0000 mg | ORAL_TABLET | Freq: Every day | ORAL | 1 refills | Status: DC

## 2024-02-05 MED ORDER — SILDENAFIL CITRATE 100 MG PO TABS: 100.0000 mg | ORAL_TABLET | Freq: Every day | ORAL | 1 refills | Status: DC | PRN

## 2024-02-05 MED ORDER — VENLAFAXINE HCL ER 150 MG PO CP24
150.0000 mg | ORAL_CAPSULE | Freq: Every day | ORAL | 1 refills | Status: DC
Start: 2024-02-05 — End: 2024-08-06

## 2024-02-05 MED ORDER — ATORVASTATIN CALCIUM 40 MG PO TABS: ORAL_TABLET | ORAL | 1 refills | Status: DC

## 2024-02-05 NOTE — Progress Notes (Signed)
 Name: Jimmy Bryant   MRN: [id number]    DOB: 1970/11/11   Date:02/05/2024       Progress Note  Subjective  Chief Complaint  Chief Complaint  Patient presents with   Medical Management of Chronic Issues   HPI   History of morbid obesity:  he had gastric bypass surgery on 11/09/2018 done at [facility] by Dr. Artist Pais. He tolerated procedure well, at the day of surgery his weight was 313 lbs he was stable 225 lb to 230 lbs at home. He states no longer staying over night on his job site, diet has improved, his son has celiac disease and they eat a lot of chicken and vegetables.  He eats smaller portion.     History of DM : he has been off medication since bariatric surgery on 11/09/18, weight has been stable between 225 lbs to 230 lbs at home  .He denies polyphagia, polydipsia or polyuria.   HTN: BP today is elevated today, we will adjust dose to 100 mg of losartan daily. No chest pain, palpitation or sob   ED and hypogonadism: seeing [facility] and getting testosterone supplementation and arimidex 1 mg once a week, his libido has improved, still takes viagra for ED, medication helps maintain erection  Hypothyroidism: he states labs were abnormal during visit at [facility], he is now taking Cytomel , denies change in bowel movements or hair loss    Hyperlipidemia: he is back on statin therapy, last LDL was at goal , down to 54 , his HDL has improved also, continue medication and life style modifications    OSA: using CPAP machine every night, all night, he is switching jobs and 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 Pressure is 9 cm H2O .Continue regular use   Major Depression in remission : he was on Effexor 150 mg and  Abilify 5 mg and was doing well emotionally however he developed symptoms of tardive dyskinesia such as biting his tongue, also involuntary movements on his neck and sometimes on his eye lids.  On his last visit we stopped Abilify and tic resolved, he is doing well on Effexor 150 mg,  energy level is up, sleeping better since no longer working at night.    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   Dyslipidemia 04/23/2015   H/O lumbosacral spine surgery 04/23/2015   H/O umbilical hernia repair 04/23/2015   ED (erectile dysfunction) of organic origin 04/23/2015   OSA on CPAP 04/23/2015   Vitamin D deficiency 04/23/2015   Decreased testosterone level 04/23/2015    Past Surgical History:  Procedure Laterality Date   COLONOSCOPY WITH PROPOFOL N/A 08/13/2019   Procedure: COLONOSCOPY WITH BIOPSY;  Surgeon: Midge Minium, MD;  Location: [facility];  Service: Endoscopy;  Laterality: N/A;   microdecompression lumbar spine  02/2011   L4-5 and L5-S1 by Dr. Loistine Simas   POLYPECTOMY N/A 08/13/2019   Procedure: POLYPECTOMY;  Surgeon: Midge Minium, MD;  Location: [facility];  Service: Endoscopy;  Laterality: N/A;   UMBILICAL HERNIA REPAIR  07/10/2011    Family History  Problem Relation Age of Onset   Diabetes  Mother    Hypertension Father    CAD Father    Hypertension Sister    Hypertension Daughter    ADD / ADHD Son     Social History   Tobacco Use   Smoking status: Former    Current packs/day: 0.00    Average packs/day: 1 pack/day for 31.7 years (31.7 ttl pk-yrs)    Types: Cigarettes    Start date: 10/28/1980    Quit date: 06/28/2012    Years since quitting: 11.6   Smokeless tobacco: Never  Substance Use Topics   Alcohol use: Yes    Alcohol/week: 0.0 standard drinks of alcohol    Comment: rarely-a beer a month     Current Outpatient Medications:    anastrozole (ARIMIDEX) 1 MG tablet, SMARTSIG:0.5 Tablet(s) By Mouth  Once a Week, Disp: , Rfl:    atorvastatin (LIPITOR) 40 MG tablet, TAKE 1 TABLET(40 MG) BY MOUTH DAILY, Disp: 90 tablet, Rfl: 1   B-D 3CC LUER-LOK SYR 18GX1-1/2 18G X 1-1/2" 3 ML MISC, SMARTSIG:1 injection Once a Week, Disp: , Rfl:    BD DISP NEEDLE 23G X 1" MISC, SMARTSIG:1 injection Once a Week, Disp: , Rfl:    liothyronine (CYTOMEL) 5 MCG tablet, Take 10 mcg by mouth daily., Disp: , Rfl:    Multiple Vitamin (MULTIVITAMIN) tablet, Take 1 tablet by mouth daily., Disp: , Rfl:    testosterone cypionate (DEPOTESTOSTERONE CYPIONATE) 200 MG/ML injection, SMARTSIG:0.9 Milliliter(s) IM Once a Week, Disp: , Rfl:    losartan (COZAAR) 100 MG tablet, Take 1 tablet (100 mg total) by mouth daily., Disp: 90 tablet, Rfl: 1   sildenafil (VIAGRA) 100 MG tablet, Take 1 tablet (100 mg total) by mouth daily as needed for erectile dysfunction., Disp: 30 tablet, Rfl: 1   venlafaxine XR (EFFEXOR-XR) 150 MG 24 hr capsule, Take 1 capsule (150 mg total) by mouth daily with breakfast., Disp: 90 capsule, Rfl: 1  Allergies  Allergen Reactions   Oxycodone-Acetaminophen Itching   Trulicity [Dulaglutide] Nausea Only    And diarrhea    I personally reviewed active problem list, medication list, allergies with the patient/caregiver today.   ROS  Ten systems reviewed and is negative except as mentioned in HPI    Objective Physical Exam Constitutional: Patient appears well-developed and well-nourished. No distress.  HEENT: head atraumatic, normocephalic, pupils equal and reactive to light, neck supple Cardiovascular: Normal rate, regular rhythm and normal heart sounds.  No murmur heard. No BLE edema. Pulmonary/Chest: Effort normal and breath sounds normal. No respiratory distress. Abdominal: Soft.  There is no tenderness. Psychiatric: Patient has a normal mood and affect. behavior is normal. Judgment and thought content normal.   Vitals:   02/05/24 1311  BP: 136/74  Pulse: 87  Resp: 16  SpO2: 98%  Weight: 233 lb  8 oz (105.9 kg)  Height: 5\' 10"  (1.778 m)    Body mass index is 33.5 kg/m.  Recent Results (from the past 2160 hours)  POCT glycosylated hemoglobin (Hb A1C)     Status: None   Collection Time: 02/05/24  1:16 PM  Result Value Ref Range   Hemoglobin A1C 4.9 4.0 - 5.6 %   HbA1c POC (<> result, manual entry)     HbA1c, POC (prediabetic range)     HbA1c, POC (controlled diabetic range)     PHQ2/9:    02/05/2024    1:07 PM 08/08/2023    2:21 PM 03/21/2023    2:21 PM 02/11/2023    7:50 AM 08/02/2022  8:10 AM  Depression screen PHQ 2/9  Decreased Interest 0 0 1 0 2  Down, Depressed, Hopeless 0 0 0 0 0  PHQ - 2 Score 0 0 1 0 2  Altered sleeping 0 0 0 0 3  Tired, decreased energy 0 0 1 0 0  Change in appetite 0 0 0 0 0  Feeling bad or failure about yourself  0 0 0 0 0  Trouble concentrating 0 0 0 0 0  Moving slowly or fidgety/restless 0 0 0 0 0  Suicidal thoughts 0 0 0 0 0  PHQ-9 Score 0 0 2 0 5  Difficult doing work/chores Not difficult at all        phq 9 is negative  Fall Risk:    08/08/2023    2:21 PM 03/21/2023    2:20 PM 02/11/2023    7:50 AM 08/02/2022    8:10 AM 01/25/2022   10:11 AM  Fall Risk   Falls in the past year? 0 0 0 0 0  Number falls in past yr: 0  0 0 0  Injury with Fall? 0  0 0 0  Risk for fall due to : No Fall Risks No Fall Risks No Fall Risks No Fall Risks No Fall Risks  Follow up Falls prevention discussed Falls evaluation completed;Education provided;Falls prevention discussed Falls prevention discussed Falls prevention discussed Falls prevention discussed     Assessment & Plan  1. Major depression in remission (HCC)  - venlafaxine XR (EFFEXOR-XR) 150 MG 24 hr capsule; Take 1 capsule (150 mg total) by mouth daily with breakfast.  Dispense: 90 capsule; Refill: 1  2. OSA on CPAP (Primary)  Compliant   3. History of diabetes mellitus, type II  - POCT glycosylated hemoglobin (Hb A1C)  4. Immunization due  - Pneumococcal conjugate vaccine  20-valent  5. Dyslipidemia  - atorvastatin (LIPITOR) 40 MG tablet; TAKE 1 TABLET(40 MG) BY MOUTH DAILY  Dispense: 90 tablet; Refill: 1  6. History of bariatric surgery  Doing well   7. Hypogonadism in male  Going to [facility]   8. ED (erectile dysfunction) of organic origin  - sildenafil (VIAGRA) 100 MG tablet; Take 1 tablet (100 mg total) by mouth daily as needed for erectile dysfunction.  Dispense: 30 tablet; Refill: 1  9. Hypertension, benign  - losartan (COZAAR) 100 MG tablet; Take 1 tablet (100 mg total) by mouth daily.  Dispense: 90 tablet; Refill: 1

## 2024-02-06 ENCOUNTER — Ambulatory Visit: Payer: Self-pay | Admitting: Family Medicine

## 2024-03-19 ENCOUNTER — Encounter: Payer: Self-pay | Admitting: Family Medicine

## 2024-03-27 ENCOUNTER — Other Ambulatory Visit: Payer: Self-pay | Admitting: Family Medicine

## 2024-03-27 DIAGNOSIS — I10 Essential (primary) hypertension: Secondary | ICD-10-CM

## 2024-06-29 ENCOUNTER — Ambulatory Visit (INDEPENDENT_AMBULATORY_CARE_PROVIDER_SITE_OTHER): Payer: Self-pay | Admitting: Family Medicine

## 2024-06-29 ENCOUNTER — Inpatient Hospital Stay: Admitting: Family Medicine

## 2024-06-29 ENCOUNTER — Encounter: Payer: Self-pay | Admitting: Family Medicine

## 2024-06-29 DIAGNOSIS — G44311 Acute post-traumatic headache, intractable: Secondary | ICD-10-CM | POA: Diagnosis not present

## 2024-06-29 DIAGNOSIS — R42 Dizziness and giddiness: Secondary | ICD-10-CM

## 2024-06-29 DIAGNOSIS — S0003XA Contusion of scalp, initial encounter: Secondary | ICD-10-CM | POA: Diagnosis not present

## 2024-06-29 DIAGNOSIS — G8911 Acute pain due to trauma: Secondary | ICD-10-CM

## 2024-06-29 DIAGNOSIS — S060X9D Concussion with loss of consciousness of unspecified duration, subsequent encounter: Secondary | ICD-10-CM

## 2024-06-29 DIAGNOSIS — M25511 Pain in right shoulder: Secondary | ICD-10-CM
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
## 2024-06-30 ENCOUNTER — Ambulatory Visit (INDEPENDENT_AMBULATORY_CARE_PROVIDER_SITE_OTHER): Payer: Self-pay | Admitting: Family Medicine

## 2024-06-30 ENCOUNTER — Telehealth: Payer: Self-pay

## 2024-06-30 ENCOUNTER — Encounter: Payer: Self-pay | Admitting: Family Medicine

## 2024-06-30 DIAGNOSIS — Z4802 Encounter for removal of sutures: Secondary | ICD-10-CM

## 2024-06-30 DIAGNOSIS — S060X9D Concussion with loss of consciousness of unspecified duration, subsequent encounter: Secondary | ICD-10-CM

## 2024-06-30 NOTE — Telephone Encounter (Signed)
 Copied from CRM #[id number]. Topic: General - Other >> Jun 29, 2024  5:36 PM Sophia H wrote: Reason for CRM: Roselie with [facility] - Calling in because of an urgent message they received regarding the patient having a Hematoma. Roselie states they have not consulted with the patient regarding his scalp laceration. Called after office was closed, please reach out # [phone]
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
## 2024-07-09 ENCOUNTER — Ambulatory Visit
Admission: RE | Admit: 2024-07-09 | Discharge: 2024-07-09 | Disposition: A | Discharge status: Home / Self Care | Source: Ambulatory Visit | Attending: Physician Assistant | Admitting: Physician Assistant

## 2024-07-09 ENCOUNTER — Ambulatory Visit
Admission: RE | Admit: 2024-07-09 | Discharge: 2024-07-09 | Disposition: A | Discharge status: Home / Self Care | Payer: Worker's Compensation | Attending: Physician Assistant | Admitting: Physician Assistant

## 2024-07-09 ENCOUNTER — Other Ambulatory Visit: Payer: Self-pay | Admitting: Physician Assistant

## 2024-07-09 DIAGNOSIS — S0003XD Contusion of scalp, subsequent encounter: Secondary | ICD-10-CM | POA: Insufficient documentation

## 2024-07-09 DIAGNOSIS — M47814 Spondylosis without myelopathy or radiculopathy, thoracic region: Secondary | ICD-10-CM | POA: Diagnosis not present

## 2024-08-06 ENCOUNTER — Encounter: Payer: Self-pay | Admitting: Family Medicine

## 2024-08-06 ENCOUNTER — Ambulatory Visit: Admitting: Family Medicine

## 2024-08-06 VITALS — BP 134/80 | HR 73 | Resp 16 | Ht 70.0 in | Wt 238.5 lb

## 2024-08-06 DIAGNOSIS — I10 Essential (primary) hypertension: Secondary | ICD-10-CM | POA: Diagnosis not present

## 2024-08-06 DIAGNOSIS — Z23 Encounter for immunization: Secondary | ICD-10-CM

## 2024-08-06 DIAGNOSIS — F325 Major depressive disorder, single episode, in full remission: Secondary | ICD-10-CM

## 2024-08-06 DIAGNOSIS — Z1159 Encounter for screening for other viral diseases: Secondary | ICD-10-CM

## 2024-08-06 DIAGNOSIS — G4733 Obstructive sleep apnea (adult) (pediatric): Secondary | ICD-10-CM | POA: Diagnosis not present

## 2024-08-06 DIAGNOSIS — Z9884 Bariatric surgery status: Secondary | ICD-10-CM

## 2024-08-06 DIAGNOSIS — E1140 Type 2 diabetes mellitus with diabetic neuropathy, unspecified: Secondary | ICD-10-CM | POA: Diagnosis not present

## 2024-08-06 DIAGNOSIS — N529 Male erectile dysfunction, unspecified: Secondary | ICD-10-CM

## 2024-08-06 DIAGNOSIS — E785 Hyperlipidemia, unspecified: Secondary | ICD-10-CM

## 2024-08-06 DIAGNOSIS — E291 Testicular hypofunction: Secondary | ICD-10-CM

## 2024-08-06 LAB — POCT GLYCOSYLATED HEMOGLOBIN (HGB A1C): Hemoglobin A1C: 5 % (ref 4.0–5.6)

## 2024-08-06 MED ORDER — OLMESARTAN MEDOXOMIL 40 MG PO TABS: 40.0000 mg | ORAL_TABLET | Freq: Every day | ORAL | 1 refills | Status: AC

## 2024-08-06 MED ORDER — VENLAFAXINE HCL ER 150 MG PO CP24: 150.0000 mg | ORAL_CAPSULE | Freq: Every day | ORAL | 1 refills | Status: AC

## 2024-08-06 MED ORDER — ATORVASTATIN CALCIUM 40 MG PO TABS: ORAL_TABLET | ORAL | 1 refills | Status: AC

## 2024-08-06 MED ORDER — SILDENAFIL CITRATE 100 MG PO TABS: 100.0000 mg | ORAL_TABLET | Freq: Every day | ORAL | 1 refills | Status: AC | PRN
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
## 2024-08-13 LAB — COMPREHENSIVE METABOLIC PANEL WITH GFR
ALT: 10 IU/L (ref 0–44)
AST: 21 IU/L (ref 0–40)
Albumin: 4.6 g/dL (ref 3.8–4.9)
Alkaline Phosphatase: 86 IU/L (ref 47–123)
BUN/Creatinine Ratio: 10 (ref 9–20)
BUN: 10 mg/dL (ref 6–24)
Bilirubin Total: 0.5 mg/dL (ref 0.0–1.2)
CO2: 24 mmol/L (ref 20–29)
Calcium: 9.4 mg/dL (ref 8.7–10.2)
Chloride: 100 mmol/L (ref 96–106)
Creatinine, Ser: 1.02 mg/dL (ref 0.76–1.27)
Globulin, Total: 2.6 g/dL (ref 1.5–4.5)
Glucose: 77 mg/dL (ref 70–99)
Potassium: 4.6 mmol/L (ref 3.5–5.2)
Sodium: 139 mmol/L (ref 134–144)
Total Protein: 7.2 g/dL (ref 6.0–8.5)
eGFR: 87 mL/min/1.73 (ref >59)

## 2024-08-13 LAB — CBC WITH DIFFERENTIAL/PLATELET
Basophils Absolute: 0.1 x10E3/uL (ref 0.0–0.2)
Basos: 1 %
EOS (ABSOLUTE): 0.1 x10E3/uL (ref 0.0–0.4)
Eos: 2 %
Hematocrit: 44.6 % (ref 37.5–51.0)
Hemoglobin: 14.3 g/dL (ref 13.0–17.7)
Immature Grans (Abs): 0 x10E3/uL (ref 0.0–0.1)
Immature Granulocytes: 0 %
Lymphocytes Absolute: 1.7 x10E3/uL (ref 0.7–3.1)
Lymphs: 28 %
MCH: 28.3 pg (ref 26.6–33.0)
MCHC: 32.1 g/dL (ref 31.5–35.7)
MCV: 88 fL (ref 79–97)
Monocytes Absolute: 0.6 x10E3/uL (ref 0.1–0.9)
Monocytes: 9 %
Neutrophils Absolute: 3.6 x10E3/uL (ref 1.4–7.0)
Neutrophils: 60 %
Platelets: 276 x10E3/uL (ref 150–450)
RBC: 5.05 x10E6/uL (ref 4.14–5.80)
RDW: 13 % (ref 11.6–15.4)
WBC: 6 x10E3/uL (ref 3.4–10.8)

## 2024-08-13 LAB — MICROALBUMIN / CREATININE URINE RATIO
Creatinine, Urine: 149.8 mg/dL
Microalb/Creat Ratio: 5 mg/g{creat} (ref 0–29)
Microalbumin, Urine: 7.7 ug/mL

## 2024-08-13 LAB — HEPATITIS B SURFACE ANTIBODY,QUALITATIVE: Hep B Surface Ab, Qual: NONREACTIVE

## 2024-08-13 LAB — B12 AND FOLATE PANEL
Folate: 8.9 ng/mL (ref >3.0)
Vitamin B-12: 376 pg/mL (ref 232–1245)

## 2024-08-13 LAB — LIPID PANEL
Chol/HDL Ratio: 3.2 ratio (ref 0.0–5.0)
Cholesterol, Total: 120 mg/dL (ref 100–199)
HDL: 37 mg/dL — ABNORMAL LOW (ref >39)
LDL Chol Calc (NIH): 67 mg/dL (ref 0–99)
Triglycerides: 79 mg/dL (ref 0–149)
VLDL Cholesterol Cal: 16 mg/dL (ref 5–40)

## 2024-08-13 LAB — VITAMIN D 25 HYDROXY (VIT D DEFICIENCY, FRACTURES): Vit D, 25-Hydroxy: 27.8 ng/mL — ABNORMAL LOW (ref 30.0–100.0)

## 2024-08-16 ENCOUNTER — Ambulatory Visit: Payer: Self-pay | Admitting: Family Medicine

## 2024-09-02 ENCOUNTER — Encounter: Payer: Self-pay | Admitting: Family Medicine

## 2024-09-09 ENCOUNTER — Other Ambulatory Visit: Payer: Self-pay | Admitting: Family Medicine

## 2024-09-09 DIAGNOSIS — I10 Essential (primary) hypertension: Secondary | ICD-10-CM
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
## 2025-02-04 ENCOUNTER — Ambulatory Visit: Admitting: Family Medicine
